# Patient Record
Sex: Female | Born: 1965 | Race: White | Hispanic: No | Marital: Married | Smoking: Former smoker
Health system: Southern US, Community
[De-identification: ages and names within clinical notes are randomized; demographics above are authoritative.]

## PROBLEM LIST (undated history)

## (undated) DIAGNOSIS — F329 Major depressive disorder, single episode, unspecified: Secondary | ICD-10-CM

## (undated) DIAGNOSIS — M797 Fibromyalgia: Secondary | ICD-10-CM

## (undated) DIAGNOSIS — G8929 Other chronic pain: Secondary | ICD-10-CM

## (undated) DIAGNOSIS — M545 Low back pain, unspecified: Secondary | ICD-10-CM

## (undated) DIAGNOSIS — E781 Pure hyperglyceridemia: Secondary | ICD-10-CM

## (undated) DIAGNOSIS — D369 Benign neoplasm, unspecified site: Secondary | ICD-10-CM

## (undated) DIAGNOSIS — I10 Essential (primary) hypertension: Secondary | ICD-10-CM

## (undated) DIAGNOSIS — T7840XA Allergy, unspecified, initial encounter: Secondary | ICD-10-CM

## (undated) DIAGNOSIS — R519 Headache, unspecified: Secondary | ICD-10-CM

## (undated) DIAGNOSIS — K219 Gastro-esophageal reflux disease without esophagitis: Secondary | ICD-10-CM

## (undated) DIAGNOSIS — F32A Depression, unspecified: Secondary | ICD-10-CM

## (undated) DIAGNOSIS — R51 Headache: Secondary | ICD-10-CM

## (undated) HISTORY — DX: Benign neoplasm, unspecified site: D36.9

## (undated) HISTORY — DX: Low back pain: M54.5

## (undated) HISTORY — DX: Major depressive disorder, single episode, unspecified: F32.9

## (undated) HISTORY — DX: Fibromyalgia: M79.7

## (undated) HISTORY — DX: Headache, unspecified: R51.9

## (undated) HISTORY — PX: POLYPECTOMY: SHX149

## (undated) HISTORY — PX: WISDOM TOOTH EXTRACTION: SHX21

## (undated) HISTORY — DX: Depression, unspecified: F32.A

## (undated) HISTORY — DX: Other chronic pain: G89.29

## (undated) HISTORY — DX: Low back pain, unspecified: M54.50

## (undated) HISTORY — PX: TONSILLECTOMY: SUR1361

## (undated) HISTORY — DX: Headache: R51

## (undated) HISTORY — DX: Essential (primary) hypertension: I10

## (undated) HISTORY — DX: Pure hyperglyceridemia: E78.1

## (undated) HISTORY — DX: Allergy, unspecified, initial encounter: T78.40XA

---

## 1999-05-11 ENCOUNTER — Other Ambulatory Visit: Admission: RE | Admit: 1999-05-11 | Discharge: 1999-05-11 | Payer: Self-pay | Admitting: Obstetrics and Gynecology

## 1999-06-10 ENCOUNTER — Ambulatory Visit (HOSPITAL_COMMUNITY): Admission: RE | Admit: 1999-06-10 | Discharge: 1999-06-10 | Payer: Self-pay

## 1999-08-17 ENCOUNTER — Observation Stay (HOSPITAL_COMMUNITY): Admission: RE | Admit: 1999-08-17 | Discharge: 1999-08-17 | Payer: Self-pay | Admitting: Surgery

## 2000-06-05 HISTORY — PX: CHOLECYSTECTOMY: SHX55

## 2000-07-06 ENCOUNTER — Other Ambulatory Visit: Admission: RE | Admit: 2000-07-06 | Discharge: 2000-07-06 | Payer: Self-pay | Admitting: Obstetrics and Gynecology

## 2000-09-29 ENCOUNTER — Emergency Department (HOSPITAL_COMMUNITY): Admission: EM | Admit: 2000-09-29 | Discharge: 2000-09-29 | Payer: Self-pay | Admitting: Emergency Medicine

## 2000-09-29 ENCOUNTER — Encounter: Payer: Self-pay | Admitting: Emergency Medicine

## 2001-09-03 ENCOUNTER — Other Ambulatory Visit: Admission: RE | Admit: 2001-09-03 | Discharge: 2001-09-03 | Payer: Self-pay | Admitting: Obstetrics and Gynecology

## 2006-06-07 ENCOUNTER — Encounter: Admission: RE | Admit: 2006-06-07 | Discharge: 2006-06-07 | Payer: Self-pay | Admitting: Obstetrics and Gynecology

## 2006-09-18 ENCOUNTER — Encounter: Admission: RE | Admit: 2006-09-18 | Discharge: 2006-09-18 | Payer: Self-pay | Admitting: Obstetrics and Gynecology

## 2011-06-01 ENCOUNTER — Encounter (HOSPITAL_COMMUNITY): Payer: Self-pay | Admitting: Pharmacy Technician

## 2011-06-08 ENCOUNTER — Encounter (HOSPITAL_COMMUNITY): Payer: Self-pay

## 2011-06-08 ENCOUNTER — Encounter (HOSPITAL_COMMUNITY)
Admission: RE | Admit: 2011-06-08 | Discharge: 2011-06-08 | Disposition: A | Discharge status: Home / Self Care | Payer: 59 | Source: Ambulatory Visit | Attending: Orthopedic Surgery | Admitting: Orthopedic Surgery

## 2011-06-08 HISTORY — DX: Gastro-esophageal reflux disease without esophagitis: K21.9

## 2011-06-08 LAB — URINALYSIS, ROUTINE W REFLEX MICROSCOPIC
Ketones, ur: NEGATIVE mg/dL
Leukocytes, UA: NEGATIVE
Nitrite: NEGATIVE
Specific Gravity, Urine: 1.008 (ref 1.005–1.030)
pH: 6 (ref 5.0–8.0)

## 2011-06-08 LAB — CBC
MCH: 28.2 pg (ref 26.0–34.0)
MCV: 85.9 fL (ref 78.0–100.0)
Platelets: 286 10*3/uL (ref 150–400)
RDW: 13.3 % (ref 11.5–15.5)
WBC: 8.9 10*3/uL (ref 4.0–10.5)

## 2011-06-08 LAB — COMPREHENSIVE METABOLIC PANEL
AST: 13 U/L (ref 0–37)
Albumin: 3.9 g/dL (ref 3.5–5.2)
Calcium: 9.5 mg/dL (ref 8.4–10.5)
Creatinine, Ser: 0.69 mg/dL (ref 0.50–1.10)

## 2011-06-08 LAB — HCG, SERUM, QUALITATIVE: Preg, Serum: NEGATIVE

## 2011-06-08 LAB — PROTIME-INR: INR: 0.97 (ref 0.00–1.49)

## 2011-06-08 LAB — SURGICAL PCR SCREEN
MRSA, PCR: NEGATIVE
Staphylococcus aureus: NEGATIVE

## 2011-06-08 NOTE — Patient Instructions (Addendum)
20 SHANINA KEPPLE  06/08/2011   Your procedure is scheduled on:06-16-2011  Report to [facility] at 0515 AM.  Call this number if you have problems the morning of surgery: [phone]   Remember:   Do not eat food or drink liquids:After Midnight.    take these medicines the morning of surgery with A SIP OF WATER:lyrica, celebrex   Do not wear jewelry, make-up or nail polish.  Do not wear lotions, powders, or perfumes. .  Do not bring valuables to the hospital.  Contacts, dentures or bridgework may not be worn into surgery.  Leave suitcase in the car. After surgery it may be brought to your room.  For patients admitted to the hospital, checkout time is 11:00 AM the day of discharge.   Patients discharged the day of surgery will not be allowed to drive home.  Name and phone number of your driver:  Special Instructions: CHG Shower Use Special Wash: 1/2 bottle night before surgery and 1/2 bottle morning of surgery. neck down avoid private area.do not shave for 2 days before showers. Spouse jeff driver day of surgery cell [phone]  Please read over the following fact sheets that you were given: MRSA Information Cain Sieve, rn [facility] pre op nurse phone number [phone]

## 2011-06-16 ENCOUNTER — Ambulatory Visit (HOSPITAL_COMMUNITY)
Admission: RE | Admit: 2011-06-16 | Discharge: 2011-06-16 | Disposition: A | Discharge status: Home / Self Care | Payer: 59 | Source: Ambulatory Visit | Attending: Orthopedic Surgery | Admitting: Orthopedic Surgery

## 2011-06-16 ENCOUNTER — Encounter (HOSPITAL_COMMUNITY): Payer: Self-pay | Admitting: *Deleted

## 2011-06-16 ENCOUNTER — Encounter (HOSPITAL_COMMUNITY): Payer: Self-pay | Admitting: Anesthesiology

## 2011-06-16 ENCOUNTER — Ambulatory Visit (HOSPITAL_COMMUNITY): Payer: 59 | Admitting: Anesthesiology

## 2011-06-16 ENCOUNTER — Encounter (HOSPITAL_COMMUNITY)
Admission: RE | Disposition: A | Discharge status: Home / Self Care | Payer: Self-pay | Source: Ambulatory Visit | Attending: Orthopedic Surgery

## 2011-06-16 DIAGNOSIS — Z01812 Encounter for preprocedural laboratory examination: Secondary | ICD-10-CM | POA: Insufficient documentation

## 2011-06-16 DIAGNOSIS — K219 Gastro-esophageal reflux disease without esophagitis: Secondary | ICD-10-CM | POA: Insufficient documentation

## 2011-06-16 DIAGNOSIS — M25559 Pain in unspecified hip: Secondary | ICD-10-CM | POA: Insufficient documentation

## 2011-06-16 DIAGNOSIS — R1013 Epigastric pain: Secondary | ICD-10-CM | POA: Insufficient documentation

## 2011-06-16 DIAGNOSIS — M7061 Trochanteric bursitis, right hip: Secondary | ICD-10-CM

## 2011-06-16 DIAGNOSIS — M76899 Other specified enthesopathies of unspecified lower limb, excluding foot: Secondary | ICD-10-CM | POA: Insufficient documentation

## 2011-06-16 DIAGNOSIS — Z79899 Other long term (current) drug therapy: Secondary | ICD-10-CM | POA: Insufficient documentation

## 2011-06-16 HISTORY — PX: EXCISION/RELEASE BURSA HIP: SHX5014

## 2011-06-16 SURGERY — RELEASE, BURSA, TROCHANTERIC
Anesthesia: Spinal | Site: Hip | Laterality: Right

## 2011-06-16 MED ORDER — CEFAZOLIN SODIUM-DEXTROSE 2-3 GM-% IV SOLR
INTRAVENOUS | Status: AC
  Filled 2011-06-16: qty 50

## 2011-06-16 MED ORDER — HETASTARCH-ELECTROLYTES 6 % IV SOLN
INTRAVENOUS | Status: DC | PRN
  Administered 2011-06-16: 08:00:00 via INTRAVENOUS

## 2011-06-16 MED ORDER — MEPERIDINE HCL 50 MG/ML IJ SOLN: 6.2500 mg | INTRAMUSCULAR | Status: DC | PRN

## 2011-06-16 MED ORDER — BUPIVACAINE LIPOSOME 1.3 % IJ SUSP
20.0000 mL | Freq: Once | INTRAMUSCULAR | Status: AC
  Administered 2011-06-16: 70 mL
  Filled 2011-06-16: qty 20

## 2011-06-16 MED ORDER — PROPOFOL 10 MG/ML IV EMUL
INTRAVENOUS | Status: DC | PRN
  Administered 2011-06-16: 140 ug/kg/min via INTRAVENOUS

## 2011-06-16 MED ORDER — HYDROMORPHONE HCL PF 1 MG/ML IJ SOLN
0.2500 mg | INTRAMUSCULAR | Status: DC | PRN
  Administered 2011-06-16 (×2): 0.5 mg via INTRAVENOUS

## 2011-06-16 MED ORDER — OXYCODONE-ACETAMINOPHEN 5-325 MG PO TABS: 1.0000 | ORAL_TABLET | ORAL | Status: AC | PRN

## 2011-06-16 MED ORDER — CEFAZOLIN SODIUM-DEXTROSE 2-3 GM-% IV SOLR
2.0000 g | Freq: Once | INTRAVENOUS | Status: AC
  Administered 2011-06-16: 2 g via INTRAVENOUS

## 2011-06-16 MED ORDER — METHOCARBAMOL 500 MG PO TABS: 500.0000 mg | ORAL_TABLET | Freq: Four times a day (QID) | ORAL | Status: AC

## 2011-06-16 MED ORDER — ACETAMINOPHEN 10 MG/ML IV SOLN
INTRAVENOUS | Status: DC | PRN
  Administered 2011-06-16: 1000 mg via INTRAVENOUS

## 2011-06-16 MED ORDER — HYDROMORPHONE HCL PF 1 MG/ML IJ SOLN
INTRAMUSCULAR | Status: AC
  Filled 2011-06-16: qty 1

## 2011-06-16 MED ORDER — OXYCODONE-ACETAMINOPHEN 5-325 MG/5ML PO SOLN
ORAL | Status: AC
  Administered 2011-06-16: 5 mL
  Filled 2011-06-16: qty 5

## 2011-06-16 MED ORDER — LIDOCAINE HCL (CARDIAC) 20 MG/ML IV SOLN
INTRAVENOUS | Status: DC | PRN
  Administered 2011-06-16: 40 mg via INTRAVENOUS

## 2011-06-16 MED ORDER — EPHEDRINE SULFATE 50 MG/ML IJ SOLN
INTRAMUSCULAR | Status: DC | PRN
  Administered 2011-06-16 (×2): 5 mg via INTRAVENOUS

## 2011-06-16 MED ORDER — PROMETHAZINE HCL 25 MG/ML IJ SOLN: 6.2500 mg | INTRAMUSCULAR | Status: DC | PRN

## 2011-06-16 MED ORDER — ACETAMINOPHEN 10 MG/ML IV SOLN
INTRAVENOUS | Status: AC
  Filled 2011-06-16: qty 100

## 2011-06-16 MED ORDER — ONDANSETRON HCL 4 MG/2ML IJ SOLN
INTRAMUSCULAR | Status: DC | PRN
  Administered 2011-06-16: 4 mg via INTRAVENOUS

## 2011-06-16 MED ORDER — FENTANYL CITRATE 0.05 MG/ML IJ SOLN
INTRAMUSCULAR | Status: DC | PRN
  Administered 2011-06-16: 50 ug via INTRAVENOUS
  Administered 2011-06-16 (×2): 25 ug via INTRAVENOUS
  Administered 2011-06-16: 50 ug via INTRAVENOUS
  Administered 2011-06-16: 25 ug via INTRAVENOUS
  Administered 2011-06-16: 50 ug via INTRAVENOUS
  Administered 2011-06-16: 25 ug via INTRAVENOUS

## 2011-06-16 MED ORDER — MIDAZOLAM HCL 5 MG/5ML IJ SOLN
INTRAMUSCULAR | Status: DC | PRN
  Administered 2011-06-16 (×2): 1 mg via INTRAVENOUS

## 2011-06-16 MED ORDER — LACTATED RINGERS IV SOLN: INTRAVENOUS | Status: DC

## 2011-06-16 MED ORDER — LACTATED RINGERS IV SOLN
INTRAVENOUS | Status: DC | PRN
  Administered 2011-06-16 (×2): via INTRAVENOUS

## 2011-06-16 SURGICAL SUPPLY — 37 items
ANCH SUT 2 CP-2 EBND QANCHR+ (Anchor) ×1 IMPLANT
ANCHOR SUPER QUICK (Anchor) ×2 IMPLANT
BAG SPEC THK2 15X12 ZIP CLS (MISCELLANEOUS) ×1
BAG ZIPLOCK 12X15 (MISCELLANEOUS) ×2 IMPLANT
BLADE EXTENDED COATED 6.5IN (ELECTRODE) IMPLANT
CLOTH BEACON ORANGE TIMEOUT ST (SAFETY) ×2 IMPLANT
DRAPE INCISE IOBAN 66X45 STRL (DRAPES) ×2 IMPLANT
DRAPE ORTHO SPLIT 77X108 STRL (DRAPES) ×4
DRAPE POUCH INSTRU U-SHP 10X18 (DRAPES) ×2 IMPLANT
DRAPE SURG ORHT 6 SPLT 77X108 (DRAPES) ×2 IMPLANT
DRAPE U-SHAPE 47X51 STRL (DRAPES) ×2 IMPLANT
DRSG ADAPTIC 3X8 NADH LF (GAUZE/BANDAGES/DRESSINGS) ×2 IMPLANT
DRSG EMULSION OIL 3X16 NADH (GAUZE/BANDAGES/DRESSINGS) ×1 IMPLANT
DRSG MEPILEX BORDER 4X4 (GAUZE/BANDAGES/DRESSINGS) IMPLANT
DRSG MEPILEX BORDER 4X8 (GAUZE/BANDAGES/DRESSINGS) ×1 IMPLANT
ELECT REM PT RETURN 9FT ADLT (ELECTROSURGICAL) ×2
ELECTRODE REM PT RTRN 9FT ADLT (ELECTROSURGICAL) ×1 IMPLANT
GLOVE BIO SURGEON STRL SZ7.5 (GLOVE) ×2 IMPLANT
GLOVE BIO SURGEON STRL SZ8 (GLOVE) ×4 IMPLANT
GOWN STRL NON-REIN LRG LVL3 (GOWN DISPOSABLE) ×2 IMPLANT
GOWN STRL REIN XL XLG (GOWN DISPOSABLE) ×2 IMPLANT
IV SET HUBERPLUS 22X1 SAFETY (NEEDLE) ×2 IMPLANT
MANIFOLD NEPTUNE II (INSTRUMENTS) ×2 IMPLANT
NS IRRIG 1000ML POUR BTL (IV SOLUTION) ×2 IMPLANT
PACK TOTAL JOINT (CUSTOM PROCEDURE TRAY) ×2 IMPLANT
POSITIONER SURGICAL ARM (MISCELLANEOUS) ×2 IMPLANT
SPONGE GAUZE 4X4 12PLY (GAUZE/BANDAGES/DRESSINGS) ×2 IMPLANT
STAPLER VISISTAT 35W (STAPLE) ×2 IMPLANT
STRIP CLOSURE SKIN 1/2X4 (GAUZE/BANDAGES/DRESSINGS) ×2 IMPLANT
SUT MNCRL AB 4-0 PS2 18 (SUTURE) ×2 IMPLANT
SUT VIC AB 1 CT1 27 (SUTURE) ×6
SUT VIC AB 1 CT1 27XBRD ANTBC (SUTURE) ×3 IMPLANT
SUT VIC AB 2-0 CT1 27 (SUTURE) ×6
SUT VIC AB 2-0 CT1 TAPERPNT 27 (SUTURE) ×3 IMPLANT
SYR 30ML LL (SYRINGE) ×2 IMPLANT
TOWEL OR 17X26 10 PK STRL BLUE (TOWEL DISPOSABLE) ×4 IMPLANT
WATER STERILE IRR 1500ML POUR (IV SOLUTION) ×2 IMPLANT

## 2011-06-16 NOTE — Progress Notes (Signed)
Patient states epigastric discomfort better.

## 2011-06-16 NOTE — Anesthesia Preprocedure Evaluation (Addendum)
Anesthesia Evaluation  Patient identified by MRN, date of birth, ID band Patient awake    Reviewed: Allergy & Precautions, H&P , NPO status , Patient's Chart, lab work & pertinent test results  Airway Mallampati: II TM Distance: >3 FB Neck ROM: Full    Dental No notable dental hx.    Pulmonary neg pulmonary ROS,  clear to auscultation  Pulmonary exam normal       Cardiovascular neg cardio ROS Regular Normal    Neuro/Psych Negative Neurological ROS  Negative Psych ROS   GI/Hepatic negative GI ROS, Neg liver ROS,   Endo/Other  Negative Endocrine ROS  Renal/GU negative Renal ROS  Genitourinary negative   Musculoskeletal negative musculoskeletal ROS (+)   Abdominal   Peds negative pediatric ROS (+)  Hematology negative hematology ROS (+)   Anesthesia Other Findings   Reproductive/Obstetrics negative OB ROS                          Anesthesia Physical Anesthesia Plan  ASA: II  Anesthesia Plan: Spinal   Post-op Pain Management:    Induction:   Airway Management Planned:   Additional Equipment:   Intra-op Plan:   Post-operative Plan:   Informed Consent: I have reviewed the patients History and Physical, chart, labs and discussed the procedure including the risks, benefits and alternatives for the proposed anesthesia with the patient or authorized representative who has indicated his/her understanding and acceptance.   Dental advisory given  Plan Discussed with:   Anesthesia Plan Comments:         Anesthesia Quick Evaluation

## 2011-06-16 NOTE — Preoperative (Signed)
Beta Blockers   Reason not to administer Beta Blockers:Not Applicable,  Not on home BB 

## 2011-06-16 NOTE — Anesthesia Postprocedure Evaluation (Signed)
  Anesthesia Post-op Note  Patient: Tonya Elliott  Procedure(s) Performed:  EXCISION/RELEASE BURSA HIP - Right Hip Bursectomy  Patient Location: PACU  Anesthesia Type: Spinal  Level of Consciousness: awake and alert   Airway and Oxygen Therapy: Patient Spontanous Breathing  Post-op Pain: mild  Post-op Assessment: Post-op Vital signs reviewed, Patient's Cardiovascular Status Stable, Respiratory Function Stable, Patent Airway and No signs of Nausea or vomiting  Post-op Vital Signs: stable  Complications: No apparent anesthesia complications. Patient c/o mild epigastric pain. She has had this before. ?GERD. Antacid offered and refused. No cardiac suspicion.

## 2011-06-16 NOTE — H&P (Signed)
  CC- Tonya Elliott is a 46 y.o. female who presents with right hip pain  Hip Pain: Patient complains of right hip pain. Onset of the symptoms was several months ago. Inciting event: none. Current symptoms include lateral pain. Associated symptoms: none, pain lying on right side. Aggravating symptoms: going up and down stairs. Patient's course of pain: gradually worsening. Patient has had no prior hip problems. Previous visits for this problem: yes-PT and injections. Evaluation to date: plain films, which were normal. MRI with gluteal tendon tear..  Treatment to date: physical therapy, which has been somewhat effective.  Past Medical History  Diagnosis Date  . GERD (gastroesophageal reflux disease)     diet controlled    Past Surgical History  Procedure Date  . Cholecystectomy 2002  . Tonsillectomy age 9    Prior to Admission medications   Medication Sig Start Date End Date Taking? Authorizing Provider  carisoprodol (SOMA) 350 MG tablet Take 350 mg by mouth every evening.    Yes Historical Provider, MD  celecoxib (CELEBREX) 200 MG capsule Take 200 mg by mouth daily.    Yes Historical Provider, MD  Multiple Vitamin (MULITIVITAMIN WITH MINERALS) TABS Take 1 tablet by mouth daily.     Yes Historical Provider, MD  pregabalin (LYRICA) 150 MG capsule Take 150 mg by mouth 2 (two) times daily.    Yes Historical Provider, MD  etonogestrel-ethinyl estradiol (NUVARING) 0.12-0.015 MG/24HR vaginal ring Place 1 each vaginally every 28 (twenty-eight) days. Insert vaginally and leave in place for 3 consecutive weeks, then remove for 1 week.     Historical Provider, MD    Physical Examination: General appearance - alert, well appearing, and in no distress Mental status - alert, oriented to person, place, and time Chest - clear to auscultation, no wheezes, rales or rhonchi, symmetric air entry Heart - normal rate, regular rhythm, normal S1, S2, no murmurs, rubs, clicks or gallops Abdomen - soft,  nontender, nondistended, no masses or organomegaly Neurological - alert, oriented, normal speech, no focal findings or movement disorder noted  A right hip exam was performed. TENDERNESS: lateral ROM: full STRENGTH: normal GAIT: normal  ASSESSMENT:Right hip intractible bursitis with gluteal tendon tear- Plan bursectomy and tendon repair. Discussed in detail with patient who elects to proceed.   Plan  Gus Rankin. Nuchem Grattan, MD    06/16/2011, 7:13 AM

## 2011-06-16 NOTE — Interval H&P Note (Signed)
History and Physical Interval Note:  06/16/2011 7:17 AM  Tonya Elliott  has presented today for surgery, with the diagnosis of right hip glutean tear   The various methods of treatment have been discussed with the patient and family. After consideration of risks, benefits and other options for treatment, the patient has consented to  Procedure(s): EXCISION/RELEASE BURSA HIP as a surgical intervention .  The patients' history has been reviewed, patient examined, no change in status, stable for surgery.  I have reviewed the patients' chart and labs.  Questions were answered to the patient's satisfaction.     Loanne Drilling

## 2011-06-16 NOTE — Brief Op Note (Signed)
06/16/2011  8:08 AM  PATIENT:  Tonya Elliott  46 y.o. female  PRE-OPERATIVE DIAGNOSIS:  right hip intractable bursitis with possible gluteal tendon tear  POST-OPERATIVE DIAGNOSIS:  right hip intractable bursitis  PROCEDURE:  Procedure(s): EXCISION/RELEASE BURSA HIP  SURGEON:  Surgeon(s): Gus Rankin Sterling Mondo  PHYSICIAN ASSISTANT:   ASSISTANTS: Avel Peace, PA-C   ANESTHESIA:   spinal  EBL:  Total I/O In: 1000 [I.V.:1000] Out: -   LOCAL MEDICATIONS USED:  OTHER Exparel 20ml  COUNTS:  YES  TOURNIQUET:  * No tourniquets in log *  DICTATION: .Other Dictation: Dictation Number [id number]  PLAN OF CARE: Discharge to home after PACU  PATIENT DISPOSITION:  PACU - hemodynamically stable. Gus Rankin Khalil Belote, MD    06/16/2011, 8:13 AM    Delay start of Pharmacological VTE agent (>24hrs) due to surgical blood loss or risk of bleeding:  {YES/NO/NOT APPLICABLE:20182

## 2011-06-16 NOTE — Op Note (Signed)
Tonya Elliott, MARINOS NO.:  [id number]  MEDICAL RECORD NO.:  [id number]  LOCATION:  [facility]                         FACILITY:  [facility]  PHYSICIAN:  Ollen Gross, M.D.    DATE OF BIRTH:  09-23-1965  DATE OF PROCEDURE:  06/16/2011 DATE OF DISCHARGE:                              OPERATIVE REPORT   PREOPERATIVE DIAGNOSIS:  Right hip intractable bursitis with questionable gluteal tendon tear.  POSTOPERATIVE DIAGNOSIS:  Right hip intractable bursitis.  PROCEDURE:  Right hip bursectomy.  SURGEON:  Ollen Gross, M.D.  ASSISTANT:  Alexzandrew L. Perkins, P.A.C.  ANESTHESIA:  Spinal.  ESTIMATED BLOOD LOSS:  Minimal.  DRAINS:  None.  COMPLICATIONS:  None.  CONDITION:  Stable to Recovery.  BRIEF CLINICAL NOTE:  This is a 46 year old female who has had chronic intractable bilateral hip pain, right worse than left.  She has had injections in the past with partial temporary relief and then physical therapy without much benefit.  MRI showed bursitis with questionable gluteal tendon tear.  Given her intractable pain and failure to respond to nonoperative management, she presents now for bursectomy with possible tendon repair if there is a tear.  PROCEDURE IN DETAIL:  After successful administration of spinal anesthetic, patient was placed in left lateral decubitus position with the right side up and held with the hip positioner.  Right lower extremity was isolated from her perineum with plastic drapes and prepped and draped in usual sterile fashion.  Short lateral incision was made with a 10 blade through subcutaneous tissue to the fascia lata, which was incised in line with the skin incision.  A significant thickened bursal sac.  It was not fluid-filled.  I removed the bursa.  We internally and externally rotated the leg in order to get the full extent of the bursa.  This led to excellent visualization of the gluteus medius tendon.  I did not see any tear of the  tendon off of the greater trochanter.  I palpated the entire tendon; it was intact.  I was able to palpate under the gluteus medius to the gluteus minimus and did not palpate any tears there.  The wound was then copiously irrigated with saline solution.  Fascia lata was closed with interrupted #1 Vicryl leaving open a small triangular area over the greater trochanter, so would not rub and cause any more friction and bursal formation.  I then used a mixture of  20 mL of Exparel mixed with 50 mL of saline to inject the gluteal musculature, the IT band, and the subcu tissues.  Subcu was then closed with interrupted #1 and interrupted 2-0 Vicryl and subcuticular running 4-0 Monocryl.  Incisions cleaned and dried and Steri-Strips and bulky sterile dressing applied. She was then awakened and transported to Recovery in stable condition.     Ollen Gross, M.D.     FA/MEDQ  D:  06/16/2011  T:  06/16/2011  Job:  [id number]

## 2011-06-16 NOTE — Transfer of Care (Signed)
Immediate Anesthesia Transfer of Care Note  Patient: Tonya Elliott  Procedure(s) Performed:  EXCISION/RELEASE BURSA HIP - Right Hip Bursectomy  Patient Location: PACU  Anesthesia Type: MAC and Spinal  Level of Consciousness: awake, alert , oriented and patient cooperative  Airway & Oxygen Therapy: Patient Spontanous Breathing and Patient connected to face mask  Post-op Assessment: Report given to PACU RN and Post -op Vital signs reviewed and stable  Post vital signs: Reviewed and stable Filed Vitals:   06/16/11 0837  BP: 119/85  Pulse: 84  Temp: 36.4 C  Resp: 12    Complications: No apparent anesthesia complications

## 2011-06-16 NOTE — Progress Notes (Signed)
Patient complains of epigastric discomfort- possible indigestion- according to patient. Dr. Acey Lav in and talked with patient. Patient does not want an antacid at this time.

## 2011-06-21 ENCOUNTER — Encounter (HOSPITAL_COMMUNITY): Payer: Self-pay | Admitting: Orthopedic Surgery

## 2011-10-24 ENCOUNTER — Ambulatory Visit (INDEPENDENT_AMBULATORY_CARE_PROVIDER_SITE_OTHER): Payer: 59 | Admitting: Family Medicine

## 2011-10-24 VITALS — BP 135/86 | HR 69 | Temp 98.3°F | Resp 16 | Ht 67.0 in | Wt 188.0 lb

## 2011-10-24 DIAGNOSIS — H6690 Otitis media, unspecified, unspecified ear: Secondary | ICD-10-CM

## 2011-10-24 DIAGNOSIS — H6980 Other specified disorders of Eustachian tube, unspecified ear: Secondary | ICD-10-CM

## 2011-10-24 MED ORDER — FLUCONAZOLE 150 MG PO TABS: ORAL_TABLET | ORAL | Status: DC

## 2011-10-24 MED ORDER — FLUTICASONE PROPIONATE 50 MCG/ACT NA SUSP: 2.0000 | Freq: Every day | NASAL | Status: DC

## 2011-10-24 MED ORDER — PREDNISONE 20 MG PO TABS: 20.0000 mg | ORAL_TABLET | Freq: Every day | ORAL | Status: AC

## 2011-10-24 MED ORDER — AMOXICILLIN 875 MG PO TABS: 875.0000 mg | ORAL_TABLET | Freq: Two times a day (BID) | ORAL | Status: AC

## 2011-10-24 NOTE — Progress Notes (Signed)
  Subjective:    Patient ID: Tonya Elliott, female    DOB: April 15, 1966, 46 y.o.   MRN: [id number]  HPI  Patient presents complaining of severe otalgia since that started yesterday as patient was  traveling back from [place], [place]. Denies otic discharge Reduced hearing Denies vertiginous symptoms  URI symptoms that started last Wednesday  Review of Systems     Objective:   Physical Exam  Constitutional: She appears well-developed.  HENT:  Right Ear: Tympanic membrane is erythematous and retracted.  Left Ear: Tympanic membrane is erythematous and retracted.  Nose: Rhinorrhea present.  Neck: Neck supple.  Cardiovascular: Normal rate, regular rhythm and normal heart sounds.   Pulmonary/Chest: Effort normal and breath sounds normal.  Neurological: She is alert.  Skin: Skin is warm.          Assessment & Plan:  (B) OM ETD Recent (R) trochanteric bursectomy(Dr. Alusio)   See medications on AVS Anticipatory guidance

## 2011-11-09 ENCOUNTER — Ambulatory Visit (INDEPENDENT_AMBULATORY_CARE_PROVIDER_SITE_OTHER): Payer: 59 | Admitting: Emergency Medicine

## 2011-11-09 VITALS — BP 130/81 | HR 76 | Temp 98.1°F | Resp 16 | Ht 68.0 in | Wt 189.0 lb

## 2011-11-09 DIAGNOSIS — H6690 Otitis media, unspecified, unspecified ear: Secondary | ICD-10-CM

## 2011-11-09 DIAGNOSIS — H66009 Acute suppurative otitis media without spontaneous rupture of ear drum, unspecified ear: Secondary | ICD-10-CM

## 2011-11-09 MED ORDER — AMOXICILLIN-POT CLAVULANATE 875-125 MG PO TABS: 1.0000 | ORAL_TABLET | Freq: Two times a day (BID) | ORAL | Status: AC

## 2011-11-09 MED ORDER — DM-GUAIFENESIN ER 60-1200 MG PO TB12: 1.0000 | ORAL_TABLET | Freq: Two times a day (BID) | ORAL | Status: AC

## 2011-11-09 NOTE — Progress Notes (Signed)
  Subjective:    Patient ID: Tonya Elliott, female    DOB: November 27, 1965, 46 y.o.   MRN: [id number]  Otalgia  There is pain in both ears. This is a recurrent problem. The current episode started in the past 7 days. The problem occurs constantly. The problem has been unchanged. There has been no fever. The pain is at a severity of 3/10. The pain is mild. Associated symptoms include rhinorrhea. Pertinent negatives include no abdominal pain, coughing, diarrhea, drainage, ear discharge, headaches, hearing loss, neck pain, rash, sore throat or vomiting. She has tried acetaminophen for the symptoms. The treatment provided no relief.      Review of Systems  Constitutional: Negative.   HENT: Positive for ear pain and rhinorrhea. Negative for hearing loss, sore throat, neck pain and ear discharge.   Eyes: Negative.   Respiratory: Negative.  Negative for cough.   Cardiovascular: Negative.   Gastrointestinal: Negative.  Negative for vomiting, abdominal pain and diarrhea.  Genitourinary: Negative.   Musculoskeletal: Negative.   Skin: Negative for rash.  Neurological: Negative for headaches.       Objective:   Physical Exam  Constitutional: She appears well-developed and well-nourished.  HENT:  Head: Normocephalic and atraumatic.  Right Ear: Ear canal normal. Tympanic membrane is bulging. A middle ear effusion is present.  Left Ear: Ear canal normal. Tympanic membrane is bulging. A middle ear effusion is present.  Eyes: Conjunctivae and EOM are normal. Pupils are equal, round, and reactive to light.  Neck: Normal range of motion. Neck supple.  Cardiovascular: Normal rate, regular rhythm and normal heart sounds.   Pulmonary/Chest: Effort normal and breath sounds normal.  Abdominal: Soft.  Musculoskeletal: Normal range of motion.  Neurological: She is alert.  Skin: Skin is dry.          Assessment & Plan:  Patient flew to [place] with a nasal drip and congestion.  Developed barotrauma on  flight.  Instructed to resume use of flonase

## 2011-11-17 ENCOUNTER — Ambulatory Visit (INDEPENDENT_AMBULATORY_CARE_PROVIDER_SITE_OTHER): Payer: 59 | Admitting: Emergency Medicine

## 2011-11-17 VITALS — BP 103/68 | HR 67 | Temp 97.7°F | Resp 16 | Ht 69.0 in | Wt 187.0 lb

## 2011-11-17 DIAGNOSIS — H6690 Otitis media, unspecified, unspecified ear: Secondary | ICD-10-CM

## 2011-11-17 DIAGNOSIS — H6980 Other specified disorders of Eustachian tube, unspecified ear: Secondary | ICD-10-CM

## 2011-11-17 NOTE — Progress Notes (Signed)
Subjective:    Patient ID: Tonya Elliott, female    DOB: 1965/09/10, 46 y.o.   MRN: [id number]  HPI    Review of Systems     Objective:   Physical Exam        Assessment & Plan:      [facility] [address] [place] [address] Phone: [phone] Fax: [phone]   Patient Name: Tonya Elliott Date of Birth: 19-Oct-1965 Medical Record Number: [id number] Gender: female Date of Encounter: 11/17/2011  History of Present Illness:  Tonya Elliott is a 46 y.o. very pleasant female patient who presents with the following:  Continued symptoms of right eustachian tube dysfunction.  Continuing treatment and has improved but not resolved.  Patient Active Problem List  Diagnosis  (none) - all problems resolved or deleted   Past Medical History  Diagnosis Date  . GERD (gastroesophageal reflux disease)     diet controlled   Past Surgical History  Procedure Date  . Cholecystectomy 2002  . Tonsillectomy age 28  . Excision/release bursa hip 06/16/2011    Procedure: EXCISION/RELEASE BURSA HIP;  Surgeon: Loanne Drilling;  Location: [facility];  Service: Orthopedics;  Laterality: Right;  Right Hip Bursectomy   History  Substance Use Topics  . Smoking status: Former Smoker -- 1.0 packs/day for 10 years    Quit date: 06/05/2000  . Smokeless tobacco: Never Used  . Alcohol Use: Yes     social   No family history on file. Allergies  Allergen Reactions  . Lemon Juice Hives    Medication list has been reviewed and updated.  Prior to Admission medications   Medication Sig Start Date End Date Taking? Authorizing Provider  amoxicillin-clavulanate (AUGMENTIN) 875-125 MG per tablet Take 1 tablet by mouth 2 (two) times daily. 11/09/11 11/19/11 Yes Phillips Odor, MD  carisoprodol (SOMA) 350 MG tablet Take 350 mg by mouth every evening.    Yes Historical Provider, MD  Dextromethorphan-Guaifenesin (MUCINEX DM MAXIMUM STRENGTH) 60-1200 MG per 12 hr tablet Take  1 tablet by mouth every 12 (twelve) hours. 11/09/11 11/19/11 Yes Phillips Odor, MD  etonogestrel-ethinyl estradiol (NUVARING) 0.12-0.015 MG/24HR vaginal ring Place 1 each vaginally every 28 (twenty-eight) days. Insert vaginally and leave in place for 3 consecutive weeks, then remove for 1 week.    Yes Historical Provider, MD  fluconazole (DIFLUCAN) 150 MG tablet 1 po today. Repeat in 5 days. 10/24/11  Yes Dois Davenport, MD  Multiple Vitamin (MULITIVITAMIN WITH MINERALS) TABS Take 1 tablet by mouth daily.     Yes Historical Provider, MD  pregabalin (LYRICA) 150 MG capsule Take 150 mg by mouth 2 (two) times daily.    Yes Historical Provider, MD  celecoxib (CELEBREX) 200 MG capsule Take 200 mg by mouth daily.     Historical Provider, MD  fluticasone (FLONASE) 50 MCG/ACT nasal spray Place 2 sprays into the nose daily. 10/24/11 10/23/12  Dois Davenport, MD    Review of Systems:  All systems reviewed normal except HEENT significant for right ear pressure and discomfort.  No pain or drainage.  No fever or chills.    Physical Examination: Filed Vitals:   11/17/11 0923  BP: 103/68  Pulse: 67  Temp: 97.7 F (36.5 C)  Resp: 16   Filed Vitals:   11/17/11 0923  Height: 5\' 9"  (1.753 m)  Weight: 187 lb (84.823 kg)   Body mass index is 27.62 kg/(m^2). Ideal Body Weight: Weight in (lb) to have  BMI = 25: 168.9   WDWN, NAD HEENT fails valsalva on right.  PRRERLA EOMI TM negative, oropharynx neg Neck supple Chest CTA Heart RRR no murmur  Abd benign  Assessment and Plan: Eustachian tube dysfunction.    Plan continue current meds.   If no resolution in one week will refer to ENT.  Patient aggreable.  Carmelina Dane, MD

## 2015-04-26 ENCOUNTER — Encounter: Payer: Self-pay | Admitting: Internal Medicine

## 2015-05-03 ENCOUNTER — Encounter: Payer: Self-pay | Admitting: Internal Medicine

## 2016-03-03 ENCOUNTER — Encounter: Payer: Self-pay | Admitting: Internal Medicine

## 2016-03-24 ENCOUNTER — Ambulatory Visit (AMBULATORY_SURGERY_CENTER): Payer: Self-pay

## 2016-03-24 VITALS — Ht 68.0 in | Wt 202.8 lb

## 2016-03-24 DIAGNOSIS — Z800 Family history of malignant neoplasm of digestive organs: Secondary | ICD-10-CM

## 2016-03-24 MED ORDER — SUPREP BOWEL PREP KIT 17.5-3.13-1.6 GM/177ML PO SOLN: 1.0000 | Freq: Once | ORAL | 0 refills | Status: AC

## 2016-03-24 MED FILL — SUPREP BOWEL PREP KIT: 17.5-3.13-1 | 1 days supply | Qty: 354 | Fill #0

## 2016-03-24 NOTE — Progress Notes (Signed)
No allergies to eggs or soy No diet meds No home oxygen No past problems with anesthesia  Declined emmi 

## 2016-04-07 ENCOUNTER — Ambulatory Visit (AMBULATORY_SURGERY_CENTER): Payer: Commercial Managed Care - HMO | Admitting: Internal Medicine

## 2016-04-07 ENCOUNTER — Encounter: Payer: Self-pay | Admitting: Internal Medicine

## 2016-04-07 VITALS — BP 148/78 | HR 74 | Temp 98.6°F | Resp 17 | Ht 68.0 in | Wt 202.0 lb

## 2016-04-07 DIAGNOSIS — D123 Benign neoplasm of transverse colon: Secondary | ICD-10-CM

## 2016-04-07 DIAGNOSIS — K635 Polyp of colon: Secondary | ICD-10-CM

## 2016-04-07 DIAGNOSIS — Z1211 Encounter for screening for malignant neoplasm of colon: Secondary | ICD-10-CM

## 2016-04-07 DIAGNOSIS — D129 Benign neoplasm of anus and anal canal: Secondary | ICD-10-CM

## 2016-04-07 DIAGNOSIS — Z800 Family history of malignant neoplasm of digestive organs: Secondary | ICD-10-CM

## 2016-04-07 DIAGNOSIS — D120 Benign neoplasm of cecum: Secondary | ICD-10-CM

## 2016-04-07 DIAGNOSIS — K621 Rectal polyp: Secondary | ICD-10-CM | POA: Diagnosis not present

## 2016-04-07 DIAGNOSIS — D128 Benign neoplasm of rectum: Secondary | ICD-10-CM

## 2016-04-07 HISTORY — PX: COLONOSCOPY: SHX174

## 2016-04-07 MED ORDER — SODIUM CHLORIDE 0.9 % IV SOLN: 500.0000 mL | INTRAVENOUS | Status: DC

## 2016-04-07 NOTE — Op Note (Signed)
Tonya Elliott Patient Name: Tonya Elliott Procedure Date: 04/07/2016 2:58 PM MRN: [id number] Endoscopist: Docia Chuck. Tonya Elliott , MD Age: 50 Referring MD:  Date of Birth: [birth year] Gender: Female Account #: [id number] Procedure:                Colonoscopy, with snare polypectomy x 3. Submucosal                            injection Indications:              Screening for colorectal malignant neoplasm Medicines:                Monitored Anesthesia Care Procedure:                Pre-Anesthesia Assessment:                           - Prior to the procedure, a History and Physical                            was performed, and patient medications and                            allergies were reviewed. The patient's tolerance of                            previous anesthesia was also reviewed. The risks                            and benefits of the procedure and the sedation                            options and risks were discussed with the patient.                            All questions were answered, and informed consent                            was obtained. Prior Anticoagulants: The patient has                            taken no previous anticoagulant or antiplatelet                            agents. ASA Grade Assessment: II - A patient with                            mild systemic disease. After reviewing the risks                            and benefits, the patient was deemed in                            satisfactory condition to undergo the procedure.  After obtaining informed consent, the colonoscope                            was passed under direct vision. Throughout the                            procedure, the patient's blood pressure, pulse, and                            oxygen saturations were monitored continuously. The                            Model CF-HQ190L [id number]) scope was introduced                            through the anus  and advanced to the the cecum,                            identified by appendiceal orifice and ileocecal                            valve. The ileocecal valve, appendiceal orifice,                            and rectum were photographed. The quality of the                            bowel preparation was excellent. The colonoscopy                            was performed without difficulty. The patient                            tolerated the procedure well. The bowel preparation                            used was SUPREP. Scope In: 3:11:02 PM Scope Out: 3:49:59 PM Scope Withdrawal Time: 0 hours 35 minutes 41 seconds  Total Procedure Duration: 0 hours 38 minutes 57 seconds  Findings:                 A 20 mm polyp was found in the distal transverse                            colon. The polyp was sessile. The polyp was removed                            with a saline injection-lift technique using a cold                            snare. Resection was piecemeal and retrieval were                            complete using a suction (via  the working channel).                            Area was tattooed with an injection of 3 mL of Spot                            (carbon black) just distal to the lesion. Please                            see photograph..                           Two polyps were found in the rectum and cecum. The                            polyps were 5 mm in size. These polyps were removed                            with a cold snare. Resection and retrieval were                            complete.                           The exam was otherwise without abnormality on                            direct and retroflexion views. Complications:            No immediate complications. Estimated blood loss:                            None. Estimated Blood Loss:     Estimated blood loss: none. Impression:               - One 20 mm polyp in the distal transverse colon,                             removed piecemeal using submucosal saline                            injection-lift and a cold snare. Resected and                            retrieved. Tattooed.                           - Two 5 mm polyps in the rectum and in the cecum,                            removed with a cold snare. Resected and retrieved.                           - The examination was otherwise normal on direct  and retroflexion views. Recommendation:           - Repeat colonoscopy in 6 months for surveillance.                           - Patient has a contact number available for                            emergencies. The signs and symptoms of potential                            delayed complications were discussed with the                            patient. Return to normal activities tomorrow.                            Written discharge instructions were provided to the                            patient.                           - Resume previous diet.                           - Continue present medications.                           - Await pathology results. Docia Chuck. Tonya Pastor, MD 04/07/2016 4:06:49 PM This report has been signed electronically.

## 2016-04-07 NOTE — Progress Notes (Signed)
Called to room to assist during endoscopic procedure.  Patient ID and intended procedure confirmed with present staff. Received instructions for my participation in the procedure from the performing physician.  

## 2016-04-07 NOTE — Progress Notes (Signed)
To recovery, report to Sechler, RN, VSS 

## 2016-04-07 NOTE — Progress Notes (Signed)
Discussed with patient and carepartner that she may have a bruise on buttocks from holding "cheeks" during procedure to maintain air insufflation. Patient and carepartner verbalized understanding.

## 2016-04-07 NOTE — Patient Instructions (Signed)
Impression/Recommendations:  Polyp handout given to patient . Repeat colonoscopy in 6 months for surveillance.  YOU HAD AN ENDOSCOPIC PROCEDURE TODAY AT [facility]:   Refer to the procedure report that was given to you for any specific questions about what was found during the examination.  If the procedure report does not answer your questions, please call your gastroenterologist to clarify.  If you requested that your care partner not be given the details of your procedure findings, then the procedure report has been included in a sealed envelope for you to review at your convenience later.  YOU SHOULD EXPECT: Some feelings of bloating in the abdomen. Passage of more gas than usual.  Walking can help get rid of the air that was put into your GI tract during the procedure and reduce the bloating. If you had a lower endoscopy (such as a colonoscopy or flexible sigmoidoscopy) you may notice spotting of blood in your stool or on the toilet paper. If you underwent a bowel prep for your procedure, you may not have a normal bowel movement for a few days.  Please Note:  You might notice some irritation and congestion in your nose or some drainage.  This is from the oxygen used during your procedure.  There is no need for concern and it should clear up in a day or so.  SYMPTOMS TO REPORT IMMEDIATELY:   Following lower endoscopy (colonoscopy or flexible sigmoidoscopy):  Excessive amounts of blood in the stool  Significant tenderness or worsening of abdominal pains  Swelling of the abdomen that is new, acute  Fever of 100F or higher For urgent or emergent issues, a gastroenterologist can be reached at any hour by calling [phone].   DIET:  We do recommend a small meal at first, but then you may proceed to your regular diet.  Drink plenty of fluids but you should avoid alcoholic beverages for 24 hours.  ACTIVITY:  You should plan to take it easy for the rest of today and you  should NOT DRIVE or use heavy machinery until tomorrow (because of the sedation medicines used during the test).    FOLLOW UP: Our staff will call the number listed on your records the next business day following your procedure to check on you and address any questions or concerns that you may have regarding the information given to you following your procedure. If we do not reach you, we will leave a message.  However, if you are feeling well and you are not experiencing any problems, there is no need to return our call.  We will assume that you have returned to your regular daily activities without incident.  If any biopsies were taken you will be contacted by phone or by letter within the next 1-3 weeks.  Please call us at [phone] if you have not heard about the biopsies in 3 weeks.    SIGNATURES/CONFIDENTIALITY: You and/or your care partner have signed paperwork which will be entered into your electronic medical record.  These signatures attest to the fact that that the information above on your After Visit Summary has been reviewed and is understood.  Full responsibility of the confidentiality of this discharge information lies with you and/or your care-partner.

## 2016-04-10 ENCOUNTER — Telehealth: Payer: Self-pay

## 2016-04-10 NOTE — Telephone Encounter (Signed)
  Follow up Call-  Call back number 04/07/2016  Post procedure Call Back phone  # [phone]  Permission to leave phone message Yes  Some recent data might be hidden     Patient questions:  Do you have a fever, pain , or abdominal swelling? No. Pain Score  0 *  Have you tolerated food without any problems? Yes.    Have you been able to return to your normal activities? Yes.    Do you have any questions about your discharge instructions: Diet   No. Medications  No. Follow up visit  No.  Do you have questions or concerns about your Care? No.  Actions: * If pain score is 4 or above: No action needed, pain <4.

## 2016-04-17 ENCOUNTER — Encounter: Payer: Self-pay | Admitting: Internal Medicine

## 2016-06-06 ENCOUNTER — Other Ambulatory Visit: Payer: Self-pay | Admitting: Obstetrics and Gynecology

## 2016-06-06 DIAGNOSIS — Z124 Encounter for screening for malignant neoplasm of cervix: Secondary | ICD-10-CM | POA: Diagnosis not present

## 2016-06-06 DIAGNOSIS — Z01419 Encounter for gynecological examination (general) (routine) without abnormal findings: Secondary | ICD-10-CM | POA: Diagnosis not present

## 2016-06-07 LAB — CYTOLOGY - PAP

## 2016-06-14 DIAGNOSIS — M9905 Segmental and somatic dysfunction of pelvic region: Secondary | ICD-10-CM | POA: Diagnosis not present

## 2016-06-14 DIAGNOSIS — M7061 Trochanteric bursitis, right hip: Secondary | ICD-10-CM | POA: Diagnosis not present

## 2016-06-14 DIAGNOSIS — M9903 Segmental and somatic dysfunction of lumbar region: Secondary | ICD-10-CM | POA: Diagnosis not present

## 2016-06-26 DIAGNOSIS — M150 Primary generalized (osteo)arthritis: Secondary | ICD-10-CM | POA: Diagnosis not present

## 2016-06-26 DIAGNOSIS — M4690 Unspecified inflammatory spondylopathy, site unspecified: Secondary | ICD-10-CM | POA: Diagnosis not present

## 2016-06-26 DIAGNOSIS — M797 Fibromyalgia: Secondary | ICD-10-CM | POA: Diagnosis not present

## 2016-06-27 DIAGNOSIS — Z1231 Encounter for screening mammogram for malignant neoplasm of breast: Secondary | ICD-10-CM | POA: Diagnosis not present

## 2016-06-30 DIAGNOSIS — R922 Inconclusive mammogram: Secondary | ICD-10-CM | POA: Diagnosis not present

## 2016-07-05 DIAGNOSIS — M7061 Trochanteric bursitis, right hip: Secondary | ICD-10-CM | POA: Diagnosis not present

## 2016-07-05 DIAGNOSIS — M9905 Segmental and somatic dysfunction of pelvic region: Secondary | ICD-10-CM | POA: Diagnosis not present

## 2016-07-05 DIAGNOSIS — M9903 Segmental and somatic dysfunction of lumbar region: Secondary | ICD-10-CM | POA: Diagnosis not present

## 2016-07-07 DIAGNOSIS — M7061 Trochanteric bursitis, right hip: Secondary | ICD-10-CM | POA: Diagnosis not present

## 2016-07-07 DIAGNOSIS — M9905 Segmental and somatic dysfunction of pelvic region: Secondary | ICD-10-CM | POA: Diagnosis not present

## 2016-07-07 DIAGNOSIS — M9903 Segmental and somatic dysfunction of lumbar region: Secondary | ICD-10-CM | POA: Diagnosis not present

## 2016-07-18 DIAGNOSIS — M9903 Segmental and somatic dysfunction of lumbar region: Secondary | ICD-10-CM | POA: Diagnosis not present

## 2016-07-18 DIAGNOSIS — M9905 Segmental and somatic dysfunction of pelvic region: Secondary | ICD-10-CM | POA: Diagnosis not present

## 2016-07-18 DIAGNOSIS — M7061 Trochanteric bursitis, right hip: Secondary | ICD-10-CM | POA: Diagnosis not present

## 2016-08-01 DIAGNOSIS — M9903 Segmental and somatic dysfunction of lumbar region: Secondary | ICD-10-CM | POA: Diagnosis not present

## 2016-08-01 DIAGNOSIS — M9905 Segmental and somatic dysfunction of pelvic region: Secondary | ICD-10-CM | POA: Diagnosis not present

## 2016-08-01 DIAGNOSIS — M7061 Trochanteric bursitis, right hip: Secondary | ICD-10-CM | POA: Diagnosis not present

## 2016-08-16 DIAGNOSIS — M797 Fibromyalgia: Secondary | ICD-10-CM | POA: Diagnosis not present

## 2016-08-16 DIAGNOSIS — M47816 Spondylosis without myelopathy or radiculopathy, lumbar region: Secondary | ICD-10-CM | POA: Diagnosis not present

## 2016-08-30 ENCOUNTER — Encounter: Payer: Self-pay | Admitting: Internal Medicine

## 2016-09-13 DIAGNOSIS — M7061 Trochanteric bursitis, right hip: Secondary | ICD-10-CM | POA: Diagnosis not present

## 2016-09-13 DIAGNOSIS — M9903 Segmental and somatic dysfunction of lumbar region: Secondary | ICD-10-CM | POA: Diagnosis not present

## 2016-09-13 DIAGNOSIS — M9905 Segmental and somatic dysfunction of pelvic region: Secondary | ICD-10-CM | POA: Diagnosis not present

## 2016-09-29 DIAGNOSIS — M9905 Segmental and somatic dysfunction of pelvic region: Secondary | ICD-10-CM | POA: Diagnosis not present

## 2016-09-29 DIAGNOSIS — M7061 Trochanteric bursitis, right hip: Secondary | ICD-10-CM | POA: Diagnosis not present

## 2016-09-29 DIAGNOSIS — M9903 Segmental and somatic dysfunction of lumbar region: Secondary | ICD-10-CM | POA: Diagnosis not present

## 2016-10-17 DIAGNOSIS — M9905 Segmental and somatic dysfunction of pelvic region: Secondary | ICD-10-CM | POA: Diagnosis not present

## 2016-10-17 DIAGNOSIS — M7061 Trochanteric bursitis, right hip: Secondary | ICD-10-CM | POA: Diagnosis not present

## 2016-10-17 DIAGNOSIS — M9903 Segmental and somatic dysfunction of lumbar region: Secondary | ICD-10-CM | POA: Diagnosis not present

## 2016-11-15 DIAGNOSIS — M9905 Segmental and somatic dysfunction of pelvic region: Secondary | ICD-10-CM | POA: Diagnosis not present

## 2016-11-15 DIAGNOSIS — M9903 Segmental and somatic dysfunction of lumbar region: Secondary | ICD-10-CM | POA: Diagnosis not present

## 2016-11-15 DIAGNOSIS — M7061 Trochanteric bursitis, right hip: Secondary | ICD-10-CM | POA: Diagnosis not present

## 2016-12-07 ENCOUNTER — Encounter: Payer: Self-pay | Admitting: Internal Medicine

## 2016-12-13 DIAGNOSIS — M9903 Segmental and somatic dysfunction of lumbar region: Secondary | ICD-10-CM | POA: Diagnosis not present

## 2016-12-13 DIAGNOSIS — M7061 Trochanteric bursitis, right hip: Secondary | ICD-10-CM | POA: Diagnosis not present

## 2016-12-13 DIAGNOSIS — M9905 Segmental and somatic dysfunction of pelvic region: Secondary | ICD-10-CM | POA: Diagnosis not present

## 2016-12-18 ENCOUNTER — Encounter: Payer: Commercial Managed Care - HMO | Admitting: Internal Medicine

## 2016-12-19 ENCOUNTER — Encounter (HOSPITAL_COMMUNITY): Payer: Self-pay | Admitting: Emergency Medicine

## 2016-12-19 ENCOUNTER — Ambulatory Visit (HOSPITAL_COMMUNITY)
Admission: EM | Admit: 2016-12-19 | Discharge: 2016-12-19 | Disposition: A | Discharge status: Home / Self Care | Payer: 59

## 2016-12-19 ENCOUNTER — Ambulatory Visit (INDEPENDENT_AMBULATORY_CARE_PROVIDER_SITE_OTHER): Payer: 59

## 2016-12-19 DIAGNOSIS — M79641 Pain in right hand: Secondary | ICD-10-CM

## 2016-12-19 DIAGNOSIS — S6991XA Unspecified injury of right wrist, hand and finger(s), initial encounter: Secondary | ICD-10-CM | POA: Diagnosis not present

## 2016-12-19 IMAGING — DX DG HAND COMPLETE 3+V*R*
3 series · 3 of 3 positions shown · non-contrast
Comparison: None.

CLINICAL DATA: Fell on concrete.  First and second digit pain.

EXAM:
RIGHT HAND - COMPLETE 3+ VIEW

[hand pa]
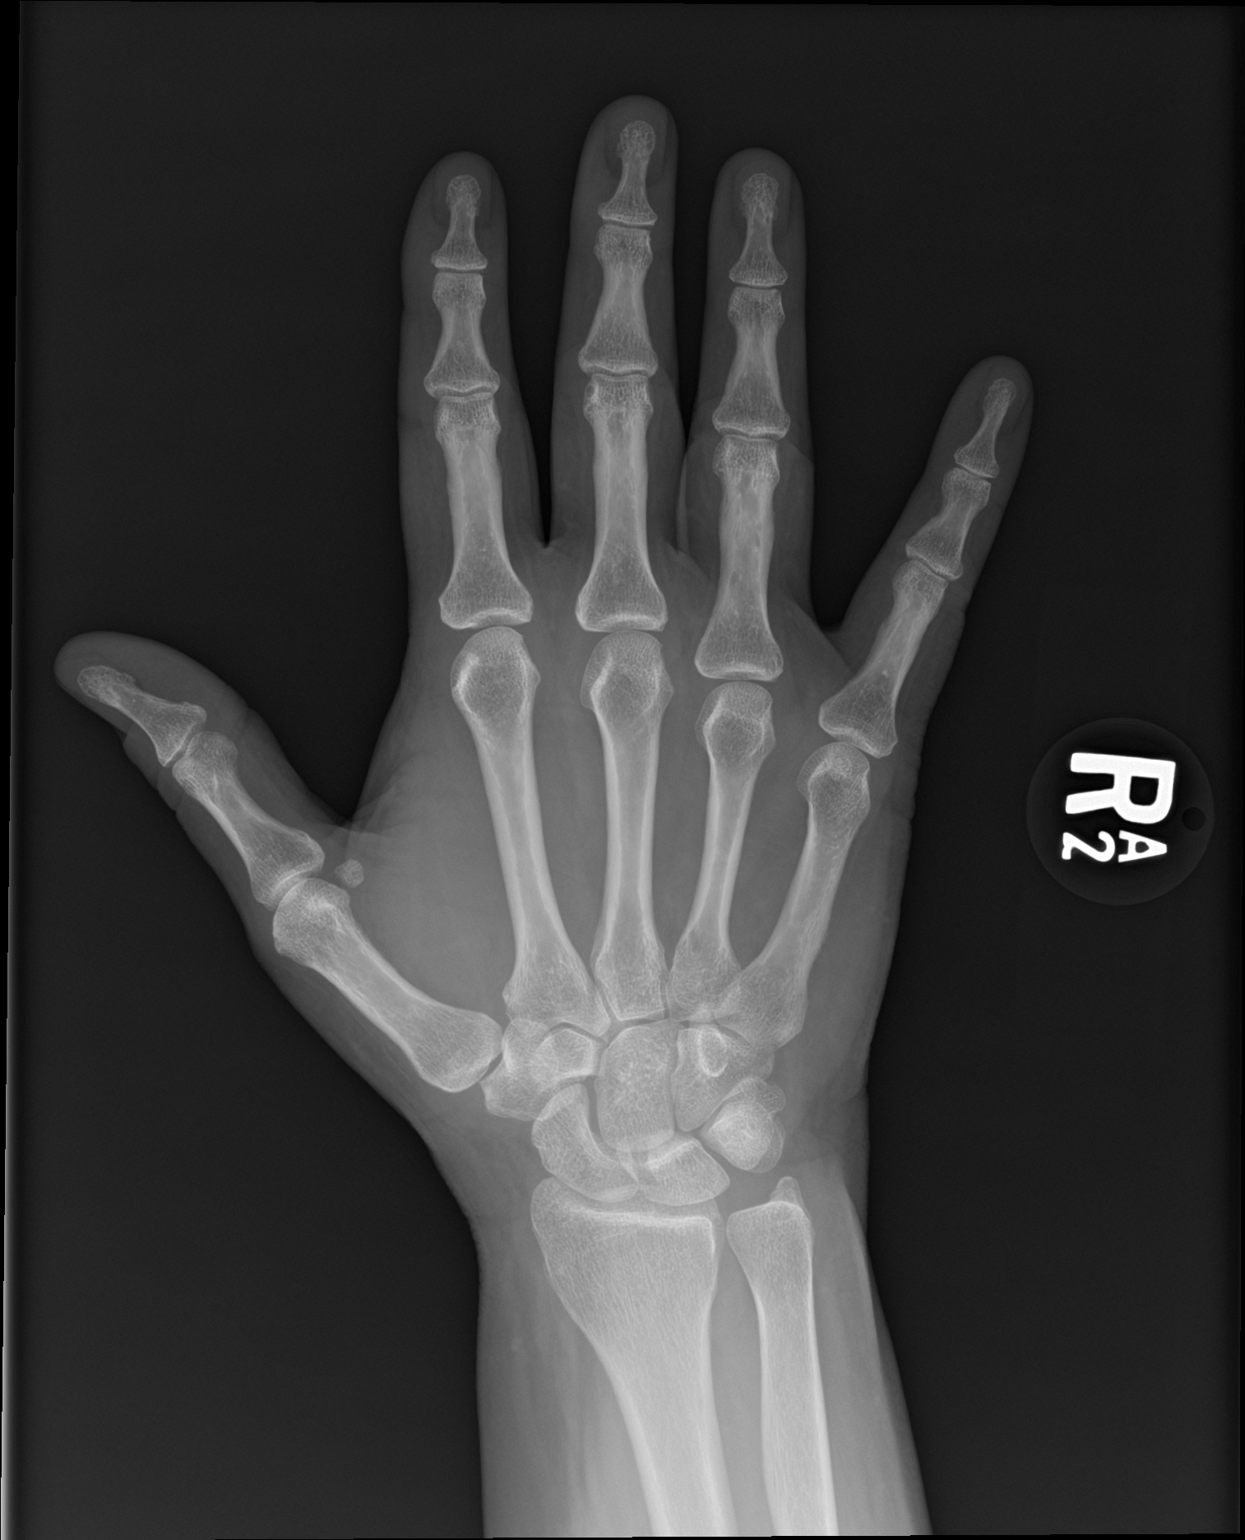

[hand obl]
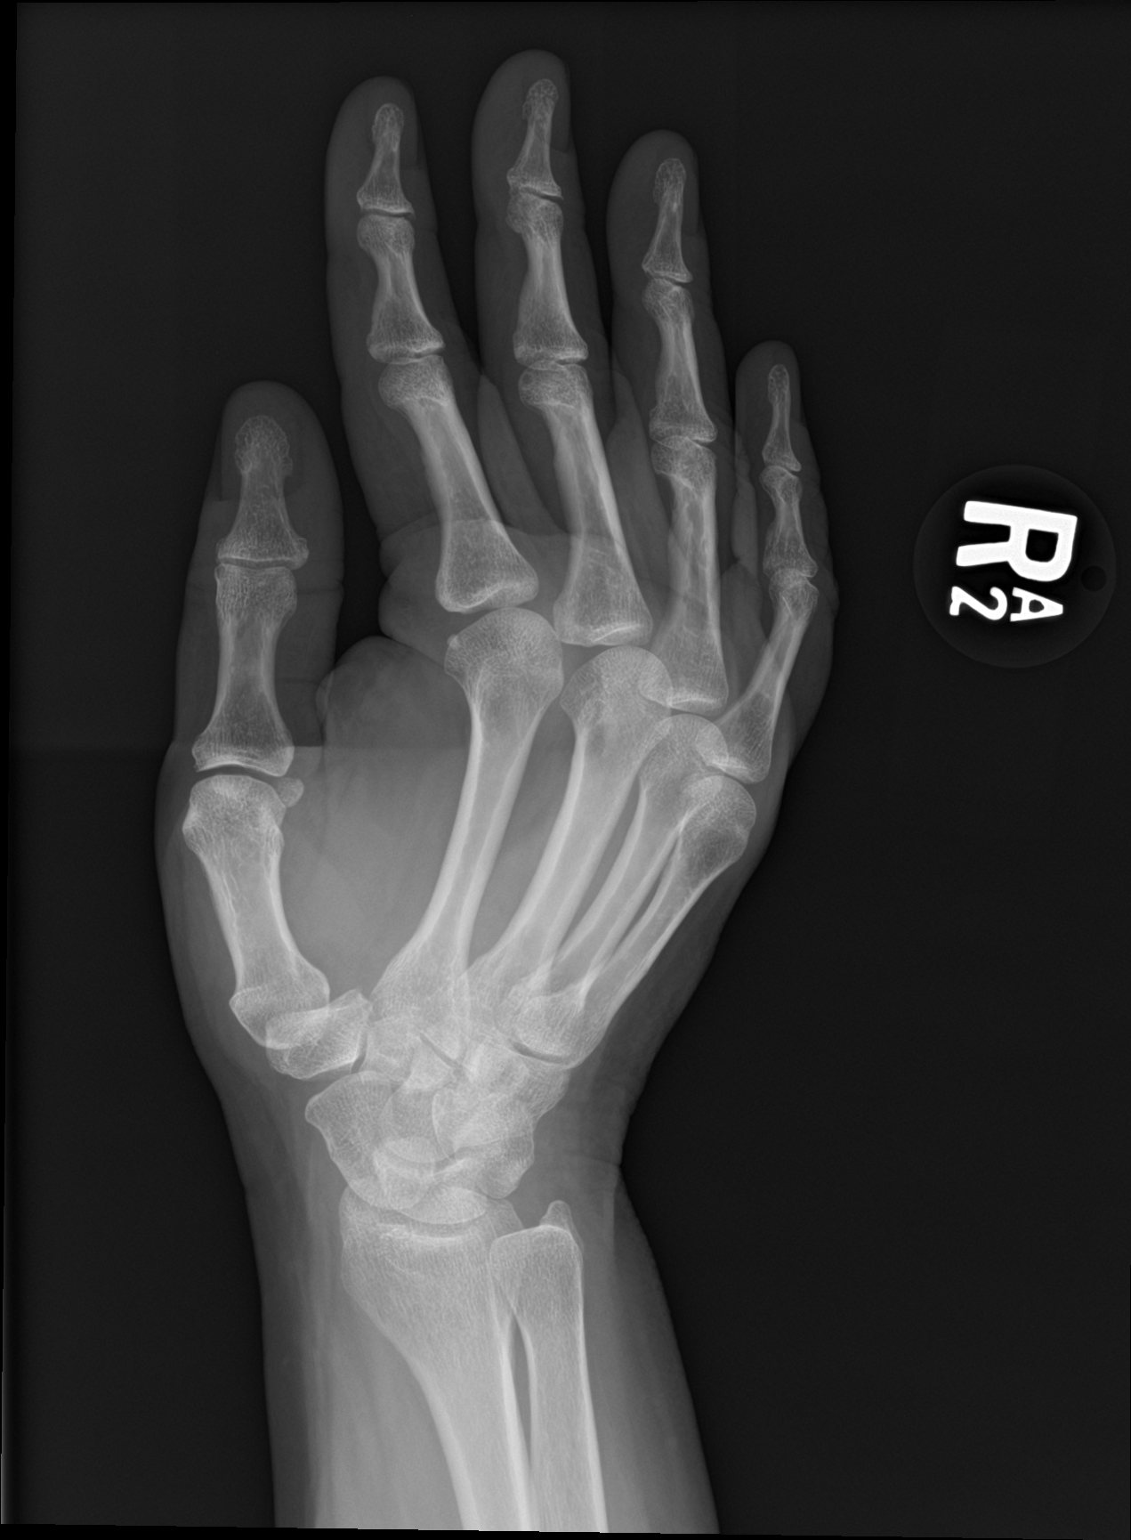

[hand lat]
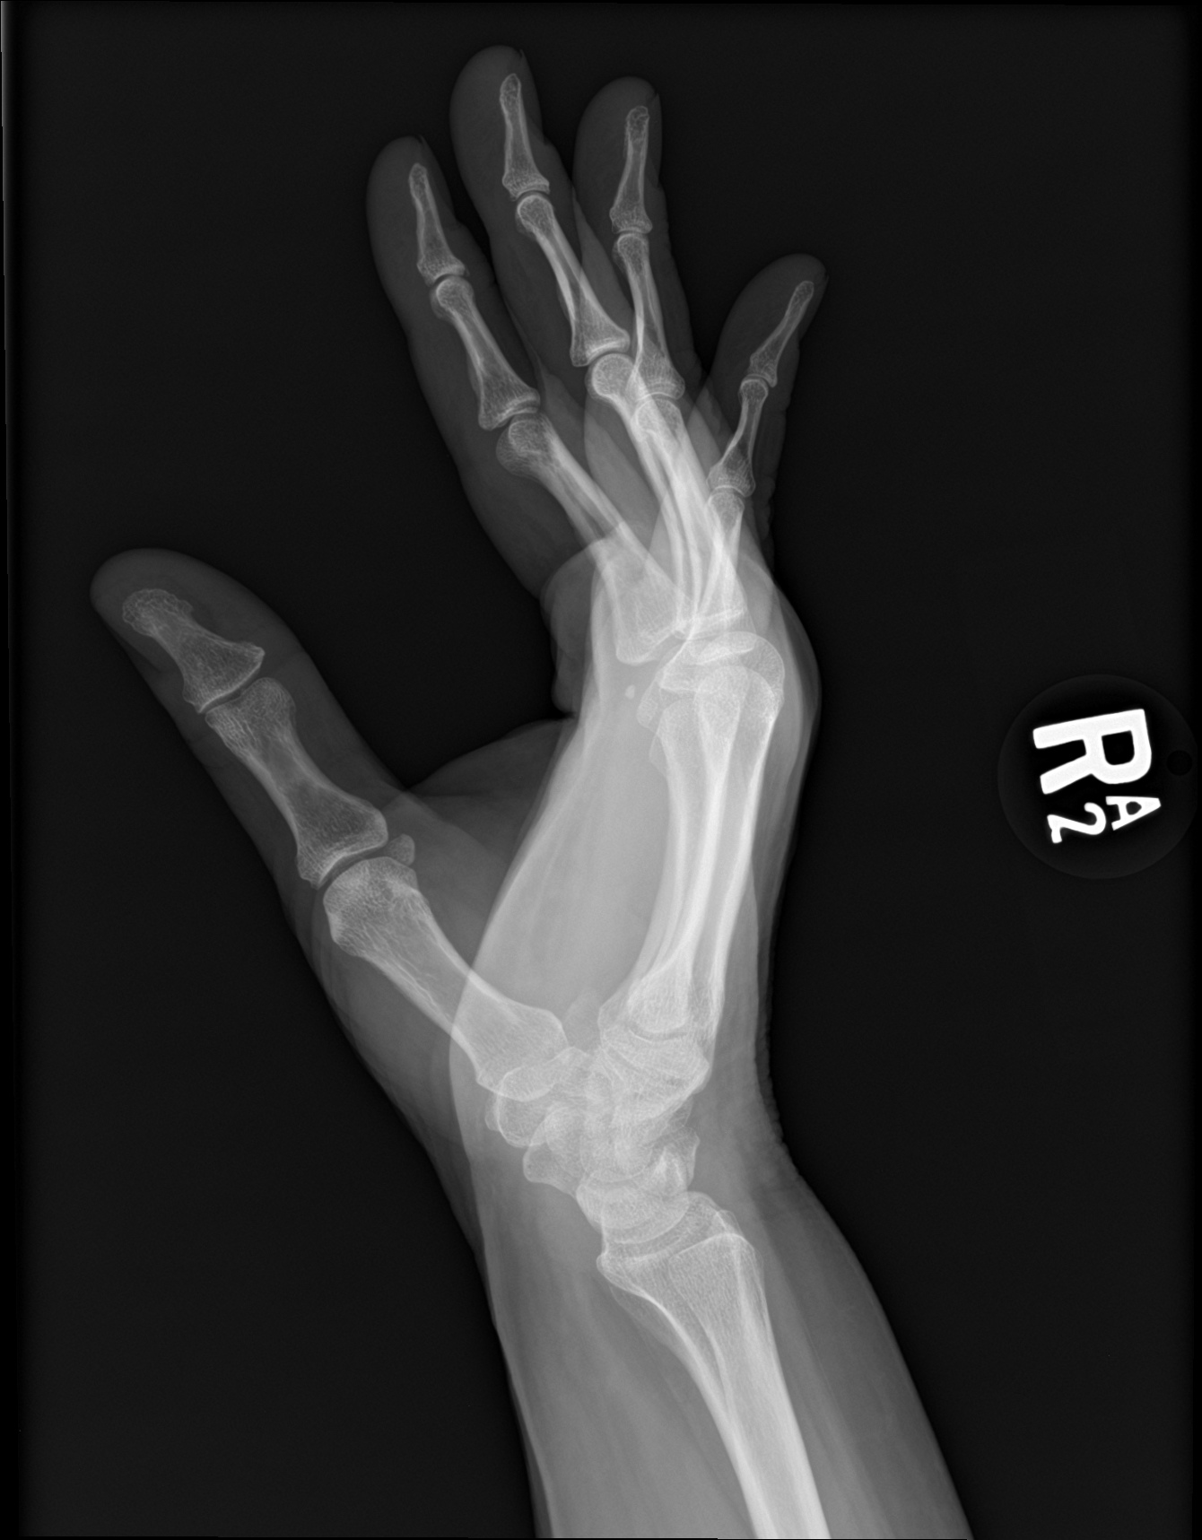

[3 of 3 positions shown; findings below may reference images not displayed]

FINDINGS: There is no evidence of fracture or dislocation. There is no
evidence of arthropathy or other focal bone abnormality. Soft
tissues are unremarkable.
IMPRESSION: Negative.

## 2016-12-19 MED ORDER — MELOXICAM 15 MG PO TABS: 15.0000 mg | ORAL_TABLET | Freq: Every day | ORAL | 0 refills | Status: DC

## 2016-12-19 NOTE — ED Triage Notes (Addendum)
[address] today in a parking lot, uneven concrete.  Landed with right hand wide open, landing on palm of hand.  No wrist pain.

## 2016-12-19 NOTE — Discharge Instructions (Signed)
No fracture or dislocation on Xray, most likely soft tissue injury. Recommend rest, ice, elevation, we have provided a splint, wear as needed for one week, may be off for showing and hygiene needs. I have included the name of an orthopedic hand specialist. Follow up with him as needed if symptoms persist.
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
## 2016-12-25 DIAGNOSIS — M4690 Unspecified inflammatory spondylopathy, site unspecified: Secondary | ICD-10-CM | POA: Diagnosis not present

## 2016-12-25 DIAGNOSIS — M797 Fibromyalgia: Secondary | ICD-10-CM | POA: Diagnosis not present

## 2016-12-25 DIAGNOSIS — M150 Primary generalized (osteo)arthritis: Secondary | ICD-10-CM | POA: Diagnosis not present

## 2016-12-26 DIAGNOSIS — M7061 Trochanteric bursitis, right hip: Secondary | ICD-10-CM | POA: Diagnosis not present

## 2016-12-26 DIAGNOSIS — M9905 Segmental and somatic dysfunction of pelvic region: Secondary | ICD-10-CM | POA: Diagnosis not present

## 2016-12-26 DIAGNOSIS — M9903 Segmental and somatic dysfunction of lumbar region: Secondary | ICD-10-CM | POA: Diagnosis not present

## 2017-01-15 DIAGNOSIS — M9905 Segmental and somatic dysfunction of pelvic region: Secondary | ICD-10-CM | POA: Diagnosis not present

## 2017-01-15 DIAGNOSIS — M9903 Segmental and somatic dysfunction of lumbar region: Secondary | ICD-10-CM | POA: Diagnosis not present

## 2017-01-15 DIAGNOSIS — M7061 Trochanteric bursitis, right hip: Secondary | ICD-10-CM | POA: Diagnosis not present

## 2017-01-22 ENCOUNTER — Encounter: Payer: Self-pay | Admitting: Internal Medicine

## 2017-01-24 DIAGNOSIS — M79641 Pain in right hand: Secondary | ICD-10-CM | POA: Diagnosis not present

## 2017-01-24 DIAGNOSIS — R52 Pain, unspecified: Secondary | ICD-10-CM | POA: Diagnosis not present

## 2017-02-01 DIAGNOSIS — M7061 Trochanteric bursitis, right hip: Secondary | ICD-10-CM | POA: Diagnosis not present

## 2017-02-01 DIAGNOSIS — M9903 Segmental and somatic dysfunction of lumbar region: Secondary | ICD-10-CM | POA: Diagnosis not present

## 2017-02-01 DIAGNOSIS — M9905 Segmental and somatic dysfunction of pelvic region: Secondary | ICD-10-CM | POA: Diagnosis not present

## 2017-02-16 ENCOUNTER — Encounter: Payer: Commercial Managed Care - HMO | Admitting: Internal Medicine

## 2017-02-16 DIAGNOSIS — M9903 Segmental and somatic dysfunction of lumbar region: Secondary | ICD-10-CM | POA: Diagnosis not present

## 2017-02-16 DIAGNOSIS — M9905 Segmental and somatic dysfunction of pelvic region: Secondary | ICD-10-CM | POA: Diagnosis not present

## 2017-02-16 DIAGNOSIS — M7061 Trochanteric bursitis, right hip: Secondary | ICD-10-CM | POA: Diagnosis not present

## 2017-03-08 ENCOUNTER — Ambulatory Visit (AMBULATORY_SURGERY_CENTER): Payer: Self-pay | Admitting: *Deleted

## 2017-03-08 VITALS — Ht 68.0 in | Wt 205.4 lb

## 2017-03-08 DIAGNOSIS — Z860100 Personal history of colon polyps, unspecified: Secondary | ICD-10-CM

## 2017-03-08 MED ORDER — SUPREP BOWEL PREP KIT 17.5-3.13-1.6 GM/177ML PO SOLN: 1.0000 | Freq: Once | ORAL | 0 refills | Status: AC

## 2017-03-08 NOTE — Progress Notes (Signed)
Patient denies any allergies to egg or soy products. Patient denies complications with anesthesia/sedation.  Patient denies oxygen use at home and denies diet medications. Colonoscopy explained and pamphlet given to patient.

## 2017-03-09 ENCOUNTER — Encounter: Payer: Self-pay | Admitting: Internal Medicine

## 2017-03-22 ENCOUNTER — Ambulatory Visit (AMBULATORY_SURGERY_CENTER): Payer: 59 | Admitting: Internal Medicine

## 2017-03-22 ENCOUNTER — Encounter: Payer: Self-pay | Admitting: Internal Medicine

## 2017-03-22 VITALS — BP 149/90 | HR 72 | Temp 99.5°F | Resp 19 | Ht 68.0 in | Wt 205.0 lb

## 2017-03-22 DIAGNOSIS — D122 Benign neoplasm of ascending colon: Secondary | ICD-10-CM | POA: Diagnosis not present

## 2017-03-22 DIAGNOSIS — D124 Benign neoplasm of descending colon: Secondary | ICD-10-CM

## 2017-03-22 DIAGNOSIS — Z860100 Personal history of colon polyps, unspecified: Secondary | ICD-10-CM

## 2017-03-22 DIAGNOSIS — Z1211 Encounter for screening for malignant neoplasm of colon: Secondary | ICD-10-CM | POA: Diagnosis not present

## 2017-03-22 MED ORDER — SODIUM CHLORIDE 0.9 % IV SOLN: 500.0000 mL | INTRAVENOUS | Status: DC

## 2017-03-22 NOTE — Op Note (Signed)
[facility] Patient Name: Tonya Elliott Procedure Date: 03/22/2017 1:48 PM MRN: [id number] Endoscopist: Docia Chuck. Henrene Pastor , MD Age: 51 Referring MD:  Date of Birth: Oct 14, 1965 Gender: Female Account #: [id number] Procedure:                Colonoscopy, with cold snare polypectomy x 4 Indications:              High risk colon cancer surveillance: Personal                            history of adenoma (10 mm or greater in size) Medicines:                Monitored Anesthesia Care Procedure:                Pre-Anesthesia Assessment:                           - Prior to the procedure, a History and Physical                            was performed, and patient medications and                            allergies were reviewed. The patient's tolerance of                            previous anesthesia was also reviewed. The risks                            and benefits of the procedure and the sedation                            options and risks were discussed with the patient.                            All questions were answered, and informed consent                            was obtained. Prior Anticoagulants: The patient has                            taken no previous anticoagulant or antiplatelet                            agents. ASA Grade Assessment: II - A patient with                            mild systemic disease. After reviewing the risks                            and benefits, the patient was deemed in                            satisfactory condition to undergo the procedure.  After obtaining informed consent, the colonoscope                            was passed under direct vision. Throughout the                            procedure, the patient's blood pressure, pulse, and                            oxygen saturations were monitored continuously. The                            Endoscope was introduced through the anus and           advanced to the the cecum, identified by                            appendiceal orifice and ileocecal valve. The                            ileocecal valve, appendiceal orifice, and rectum                            were photographed. The quality of the bowel                            preparation was excellent. The colonoscopy was                            performed without difficulty. The patient tolerated                            the procedure well. The bowel preparation used was                            SUPREP. Scope In: 2:28:45 PM Scope Out: 3:04:31 PM Scope Withdrawal Time: 0 hours 29 minutes 20 seconds  Total Procedure Duration: 0 hours 35 minutes 46 seconds  Findings:                 The previous marking tattoo was identified.                            Proximal to this was a large area of scar tissue.                            Within this was approximately 7 mm of adenomatous                            appearing tissue surrounded by scar in the                            descending colon. The polypoid tissue was sessile.  The polyp was removed with a cold snare. Hot snare                            tip cautery was used cauterize the central portion                            post polypectomy. The resection was felt to be                            complete and retrieved tissue sent for pathologic                            analysis.                           Three sessile polyps were found in the descending                            colon and ascending colon. The polyps were 3 to 5                            mm in size. These polyps were removed with a cold                            snare. Resection and retrieval were complete.                           The exam was otherwise without abnormality on                            direct and retroflexion views. Complications:            No immediate complications. Estimated blood loss:                             None. Estimated Blood Loss:     Estimated blood loss: none. Impression:               - One 7 mm polyp in the descending colon in prior                            piecemeal polypectomy bed, removed with a cold                            snare, and touched up with cautery. Resected and                            retrieved.                           - Three 3 to 5 mm polyps in the descending colon                            and in the ascending colon, removed with a  cold                            snare. Resected and retrieved.                           - The examination was otherwise normal on direct                            and retroflexion views. Recommendation:           - Repeat colonoscopy in 1 year for surveillance.                           - Patient has a contact number available for                            emergencies. The signs and symptoms of potential                            delayed complications were discussed with the                            patient. Return to normal activities tomorrow.                            Written discharge instructions were provided to the                            patient.                           - Resume previous diet.                           - Continue present medications.                           - Await pathology results. Docia Chuck. Henrene Pastor, MD 03/22/2017 3:20:45 PM This report has been signed electronically.

## 2017-03-22 NOTE — Progress Notes (Signed)
Called to room to assist during endoscopic procedure.  Patient ID and intended procedure confirmed with present staff. Received instructions for my participation in the procedure from the performing physician.  

## 2017-03-22 NOTE — Progress Notes (Signed)
To PACU, VSS. Report to [email or web address] 

## 2017-03-22 NOTE — Progress Notes (Signed)
Pt's states no medical or surgical changes since previsit or office visit. 

## 2017-03-22 NOTE — Patient Instructions (Signed)
  Handout given : Polyps   YOU HAD AN ENDOSCOPIC PROCEDURE TODAY AT [facility]:   Refer to the procedure report that was given to you for any specific questions about what was found during the examination.  If the procedure report does not answer your questions, please call your gastroenterologist to clarify.  If you requested that your care partner not be given the details of your procedure findings, then the procedure report has been included in a sealed envelope for you to review at your convenience later.  YOU SHOULD EXPECT: Some feelings of bloating in the abdomen. Passage of more gas than usual.  Walking can help get rid of the air that was put into your GI tract during the procedure and reduce the bloating. If you had a lower endoscopy (such as a colonoscopy or flexible sigmoidoscopy) you may notice spotting of blood in your stool or on the toilet paper. If you underwent a bowel prep for your procedure, you may not have a normal bowel movement for a few days.  Please Note:  You might notice some irritation and congestion in your nose or some drainage.  This is from the oxygen used during your procedure.  There is no need for concern and it should clear up in a day or so.  SYMPTOMS TO REPORT IMMEDIATELY:   Following lower endoscopy (colonoscopy or flexible sigmoidoscopy):  Excessive amounts of blood in the stool  Significant tenderness or worsening of abdominal pains  Swelling of the abdomen that is new, acute  Fever of 100F or higher  For urgent or emergent issues, a gastroenterologist can be reached at any hour by calling [phone].   DIET:  We do recommend a small meal at first, but then you may proceed to your regular diet.  Drink plenty of fluids but you should avoid alcoholic beverages for 24 hours.  ACTIVITY:  You should plan to take it easy for the rest of today and you should NOT DRIVE or use heavy machinery until tomorrow (because of the sedation  medicines used during the test).    FOLLOW UP: Our staff will call the number listed on your records the next business day following your procedure to check on you and address any questions or concerns that you may have regarding the information given to you following your procedure. If we do not reach you, we will leave a message.  However, if you are feeling well and you are not experiencing any problems, there is no need to return our call.  We will assume that you have returned to your regular daily activities without incident.  If any biopsies were taken you will be contacted by phone or by letter within the next 1-3 weeks.  Please call us at [phone] if you have not heard about the biopsies in 3 weeks.    SIGNATURES/CONFIDENTIALITY: You and/or your care partner have signed paperwork which will be entered into your electronic medical record.  These signatures attest to the fact that that the information above on your After Visit Summary has been reviewed and is understood.  Full responsibility of the confidentiality of this discharge information lies with you and/or your care-partner.

## 2017-03-23 ENCOUNTER — Telehealth: Payer: Self-pay

## 2017-03-23 NOTE — Telephone Encounter (Signed)
  Follow up Call-  Call Rogue Pautler number 03/22/2017 04/07/2016  Post procedure Call Aziah Kaiser phone  # [phone]  Permission to leave phone message Yes Yes  Some recent data might be hidden     Patient questions:  Do you have a fever, pain , or abdominal swelling? No. Pain Score  0 *  Have you tolerated food without any problems? Yes.    Have you been able to return to your normal activities? Yes.    Do you have any questions about your discharge instructions: Diet   No. Medications  No. Follow up visit  No.  Do you have questions or concerns about your Care? No.  Actions: * If pain score is 4 or above: No action needed, pain <4.

## 2017-03-27 ENCOUNTER — Encounter: Payer: Self-pay | Admitting: Internal Medicine

## 2017-03-27 DIAGNOSIS — Z0000 Encounter for general adult medical examination without abnormal findings: Secondary | ICD-10-CM | POA: Diagnosis not present

## 2017-04-02 DIAGNOSIS — M459 Ankylosing spondylitis of unspecified sites in spine: Secondary | ICD-10-CM | POA: Diagnosis not present

## 2017-04-02 DIAGNOSIS — Z23 Encounter for immunization: Secondary | ICD-10-CM | POA: Diagnosis not present

## 2017-04-02 DIAGNOSIS — E781 Pure hyperglyceridemia: Secondary | ICD-10-CM | POA: Diagnosis not present

## 2017-04-02 DIAGNOSIS — M797 Fibromyalgia: Secondary | ICD-10-CM | POA: Diagnosis not present

## 2017-04-02 DIAGNOSIS — Z0000 Encounter for general adult medical examination without abnormal findings: Secondary | ICD-10-CM | POA: Diagnosis not present

## 2017-05-07 ENCOUNTER — Encounter: Payer: Self-pay | Admitting: Neurology

## 2017-05-08 ENCOUNTER — Ambulatory Visit: Payer: 59 | Admitting: Neurology

## 2017-05-08 ENCOUNTER — Encounter: Payer: Self-pay | Admitting: Neurology

## 2017-05-08 ENCOUNTER — Encounter (INDEPENDENT_AMBULATORY_CARE_PROVIDER_SITE_OTHER): Payer: Self-pay

## 2017-05-08 VITALS — BP 151/91 | HR 83 | Ht 68.0 in | Wt 203.0 lb

## 2017-05-08 DIAGNOSIS — M62838 Other muscle spasm: Secondary | ICD-10-CM | POA: Diagnosis not present

## 2017-05-08 DIAGNOSIS — M791 Myalgia, unspecified site: Secondary | ICD-10-CM

## 2017-05-08 DIAGNOSIS — M450 Ankylosing spondylitis of multiple sites in spine: Secondary | ICD-10-CM | POA: Diagnosis not present

## 2017-05-08 DIAGNOSIS — Z789 Other specified health status: Secondary | ICD-10-CM

## 2017-05-08 DIAGNOSIS — R0683 Snoring: Secondary | ICD-10-CM

## 2017-05-08 DIAGNOSIS — T481X5A Adverse effect of skeletal muscle relaxants [neuromuscular blocking agents], initial encounter: Secondary | ICD-10-CM

## 2017-05-08 DIAGNOSIS — G4761 Periodic limb movement disorder: Secondary | ICD-10-CM | POA: Diagnosis not present

## 2017-05-08 NOTE — Patient Instructions (Signed)
Hypersomnia Hypersomnia is when you feel extremely tired during the day even though you're getting plenty of sleep at night. You may need to take naps during the day, and you may also be extremely difficult to wake up when you are sleeping. What are the causes? The cause of your hypersomnia may not be known. Hypersomnia may be caused by:  Medicines.  Sleep disorders, such as narcolepsy.  Trauma or injury to your head or nervous system.  Using drugs or alcohol.  Tumors.  Medical conditions, such as depression or hypothyroidism.  Genetics.  What are the signs or symptoms? The main symptoms of hypersomnia include:  Feeling extremely tired throughout the day.  Being very difficult to wake up.  Sleeping for longer and longer periods.  Taking naps throughout the day.  Other symptoms may include:  Feeling: ? Restless. ? Annoyed. ? Anxious. ? Low energy.  Having difficulty: ? Remembering. ? Speaking. ? Thinking.  Losing your appetite.  Experiencing hallucinations.  How is this diagnosed? Hypersomnia may be diagnosed by:  Medical history and physical exam. This will include a sleep history.  Completing sleep logs.  Tests may also be done, such as: ? Polysomnography. ? Multiple sleep latency test (MSLT).  How is this treated? There is no cure for hypersomnia, but treatment can be very effective in helping manage the condition. Treatment may include:  Lifestyle and sleeping strategies to help cope with the condition.  Stimulant medicines.  Treating any underlying causes of hypersomnia.  Follow these instructions at home:  Take medicines only as directed by your health care provider.  Schedule short naps for when you feel sleepiest during the day. Tell your employer or teachers that you have hypersomnia. You may be able to adjust your schedule to include time for naps.  Avoid drinking alcohol or caffeinated beverages.  Do not eat a heavy meal before  bedtime. Eat at about the same times every day.  Do not drive or operate heavy machinery if you are sleepy.  Do not swim or go out on the water without a life jacket.  If possible, adjust your schedule so that you do not have to work or be active at night.  Keep all follow-up visits as directed by your health care provider. This is important. Contact a health care provider if:  You have new symptoms.  Your symptoms get worse. Get help right away if: You have serious thoughts of hurting yourself or someone else. This information is not intended to replace advice given to you by your health care provider. Make sure you discuss any questions you have with your health care provider. Document Released: 05/12/2002 Document Revised: 10/28/2015 Document Reviewed: 12/25/2013 [employer] Interactive Patient Education  2018 [employer]  
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
## 2017-05-17 ENCOUNTER — Telehealth: Payer: Self-pay

## 2017-05-17 DIAGNOSIS — G4733 Obstructive sleep apnea (adult) (pediatric): Secondary | ICD-10-CM

## 2017-05-17 NOTE — Telephone Encounter (Signed)
[employer] denied in-lab study. They do not approve in-lab if patient is on narcotics anymore. Need a HST order. Thanks

## 2017-06-01 DIAGNOSIS — M9901 Segmental and somatic dysfunction of cervical region: Secondary | ICD-10-CM | POA: Diagnosis not present

## 2017-06-01 DIAGNOSIS — M9903 Segmental and somatic dysfunction of lumbar region: Secondary | ICD-10-CM | POA: Diagnosis not present

## 2017-06-01 DIAGNOSIS — M9905 Segmental and somatic dysfunction of pelvic region: Secondary | ICD-10-CM | POA: Diagnosis not present

## 2017-06-06 ENCOUNTER — Ambulatory Visit (INDEPENDENT_AMBULATORY_CARE_PROVIDER_SITE_OTHER): Payer: 59 | Admitting: Neurology

## 2017-06-06 DIAGNOSIS — G4733 Obstructive sleep apnea (adult) (pediatric): Secondary | ICD-10-CM

## 2017-06-15 DIAGNOSIS — M9903 Segmental and somatic dysfunction of lumbar region: Secondary | ICD-10-CM | POA: Diagnosis not present

## 2017-06-15 DIAGNOSIS — M9901 Segmental and somatic dysfunction of cervical region: Secondary | ICD-10-CM | POA: Diagnosis not present

## 2017-06-15 DIAGNOSIS — M9905 Segmental and somatic dysfunction of pelvic region: Secondary | ICD-10-CM | POA: Diagnosis not present

## 2017-06-18 ENCOUNTER — Other Ambulatory Visit: Payer: Self-pay | Admitting: Neurology

## 2017-06-18 DIAGNOSIS — R0683 Snoring: Secondary | ICD-10-CM

## 2017-06-18 DIAGNOSIS — M791 Myalgia, unspecified site: Secondary | ICD-10-CM

## 2017-06-18 DIAGNOSIS — M62838 Other muscle spasm: Secondary | ICD-10-CM

## 2017-06-18 DIAGNOSIS — M450 Ankylosing spondylitis of multiple sites in spine: Secondary | ICD-10-CM

## 2017-06-18 DIAGNOSIS — T481X5A Adverse effect of skeletal muscle relaxants [neuromuscular blocking agents], initial encounter: Secondary | ICD-10-CM

## 2017-06-18 DIAGNOSIS — G4761 Periodic limb movement disorder: Secondary | ICD-10-CM

## 2017-06-18 NOTE — Procedures (Signed)
[facility] [address] [place], [place] [address] NAME: Tonya Elliott                                                         DOB: 06-15-1965 MEDICAL RECORD No:  [id number]                                   DOS: 06/06/2017 REFERRING PHYSICIAN: Marton Redwood, M.D.  STUDY PERFORMED: HST, by apnea link   HISTORY:  Patient Tonya Elliott is a 52 year old Caucasian, right-handed, and married Female who is seen here for evaluation of sleep quality, architecture, and causes of excessive daytime sleepiness (EDS). She felt her sleep is no longer restorative and refreshing, and has been told that she snores. She feels significantly more fatigued, and less excessively daytime sleepy.  She also reports aching muscles, joints and back pain- and carries a diagnosis of ankylosing spondylitis. She does have neither diabetes, nor hypertension, no heart disease of any kind.   She rises at 7 AM and estimates an average of 7-9 hours of nocturnal sleep.  Does sleep alone, because of her nocturnal restlessness and snoring which bothered her spouse.  She does also have back pain and stiffness related to her ankylosing spondylitis. Epworth Sleepiness Score endorsed at 14/24 points, Fatigue severity score at 63- the highest score possible. Her BMI is 30.8.     STUDY RESULTS: Total Recording Time: 7 hours, 59 minutes Total Apnea/Hypopnea Index (AHI): 3.2 /hr. RDI was 5.9/hr.  Average Oxygen Saturation: 94%: Lowest Oxygen Desaturation: 82 %  Total Time Oxygen Saturation Below 89% Sp02: 6.0 min.  Average Heart Rate: 77 bpm in NSR (64-103 bpm)   IMPRESSION:  There is not enough apnea recorded to explain her daytime sleepiness and fatigue, neither is there excessive snoring or hypoxemia of clinical significance.  RECOMMENDATION: Her restlessness is most likely related to pain. PLMs cannot be evaluated in an unattended HST. Autoimmune diseases are causing fatigue and daytime sleepiness  without other primary sleep disorders. I will offer a repeat sleep study- attended in lab- if the patient feels that this HST did not reflect her normal nightly sleep.  I certify that I have reviewed the raw data recording prior to the issuance of this report in accordance with the standards of the American Academy of Sleep Medicine (AASM).  Larey Seat, M.D.     06-18-2017  Medical Director of [place] [facility], [place] of the ABPN and ABSM, and accredited by the AASM

## 2017-06-20 ENCOUNTER — Telehealth: Payer: Self-pay

## 2017-06-20 NOTE — Telephone Encounter (Signed)
I called pt. I advised her that there is not enough apnea recorded in her HST to explain her daytime sleepiness and  Fatigue, and neither is there excessive snoring or hypoxemia of clinical significance. Dr. Brett Fairy recommends an attended sleep study if pt feels that the HST did not reflect her normal nightly sleep. Pt's restlessness is most likely related to her pain and PLMs cannot be evaluated in an unattended HST. Pt is agreeable to an attended PSG if her insurance will allow for it. Will send to our sleep lab manager for further review. Pt verbalized understanding of results. Pt had no questions at this time but was encouraged to call back if questions arise.

## 2017-06-20 NOTE — Telephone Encounter (Signed)
-----   Message from Larey Seat, MD sent at 06/18/2017 10:24 AM EST ----- IMPRESSION: There is not enough apnea recorded to explain her daytime sleepiness and fatigue, neither is there excessive snoring or hypoxemia of clinical significance.   RECOMMENDATION: Her restlessness is most likely related to pain. PLMs cannot be evaluated in an unattended HST. Autoimmune  diseases are causing fatigue and daytime sleepiness without other primary sleep disorders. I will offer a repeat sleep study- attended in lab- if the patient feels that this HST did not reflect her normal nightly sleep.

## 2017-06-27 DIAGNOSIS — M4690 Unspecified inflammatory spondylopathy, site unspecified: Secondary | ICD-10-CM | POA: Diagnosis not present

## 2017-06-27 DIAGNOSIS — M150 Primary generalized (osteo)arthritis: Secondary | ICD-10-CM | POA: Diagnosis not present

## 2017-06-27 DIAGNOSIS — M797 Fibromyalgia: Secondary | ICD-10-CM | POA: Diagnosis not present

## 2017-06-29 DIAGNOSIS — M9903 Segmental and somatic dysfunction of lumbar region: Secondary | ICD-10-CM | POA: Diagnosis not present

## 2017-06-29 DIAGNOSIS — M9901 Segmental and somatic dysfunction of cervical region: Secondary | ICD-10-CM | POA: Diagnosis not present

## 2017-06-29 DIAGNOSIS — M9905 Segmental and somatic dysfunction of pelvic region: Secondary | ICD-10-CM | POA: Diagnosis not present

## 2017-07-02 DIAGNOSIS — Z1231 Encounter for screening mammogram for malignant neoplasm of breast: Secondary | ICD-10-CM | POA: Diagnosis not present

## 2017-07-11 DIAGNOSIS — M9905 Segmental and somatic dysfunction of pelvic region: Secondary | ICD-10-CM | POA: Diagnosis not present

## 2017-07-11 DIAGNOSIS — M9903 Segmental and somatic dysfunction of lumbar region: Secondary | ICD-10-CM | POA: Diagnosis not present

## 2017-07-11 DIAGNOSIS — M9901 Segmental and somatic dysfunction of cervical region: Secondary | ICD-10-CM | POA: Diagnosis not present

## 2017-07-24 ENCOUNTER — Ambulatory Visit (INDEPENDENT_AMBULATORY_CARE_PROVIDER_SITE_OTHER): Payer: 59 | Admitting: Neurology

## 2017-07-24 DIAGNOSIS — M791 Myalgia, unspecified site: Secondary | ICD-10-CM

## 2017-07-24 DIAGNOSIS — T481X5A Adverse effect of skeletal muscle relaxants [neuromuscular blocking agents], initial encounter: Secondary | ICD-10-CM

## 2017-07-24 DIAGNOSIS — G4761 Periodic limb movement disorder: Secondary | ICD-10-CM

## 2017-07-24 DIAGNOSIS — R0683 Snoring: Secondary | ICD-10-CM

## 2017-07-24 DIAGNOSIS — M450 Ankylosing spondylitis of multiple sites in spine: Secondary | ICD-10-CM

## 2017-07-24 DIAGNOSIS — Z01419 Encounter for gynecological examination (general) (routine) without abnormal findings: Secondary | ICD-10-CM | POA: Diagnosis not present

## 2017-07-24 DIAGNOSIS — Z124 Encounter for screening for malignant neoplasm of cervix: Secondary | ICD-10-CM | POA: Diagnosis not present

## 2017-07-27 ENCOUNTER — Other Ambulatory Visit: Payer: Self-pay | Admitting: Neurology

## 2017-07-27 DIAGNOSIS — G4734 Idiopathic sleep related nonobstructive alveolar hypoventilation: Secondary | ICD-10-CM

## 2017-07-27 DIAGNOSIS — G4761 Periodic limb movement disorder: Secondary | ICD-10-CM

## 2017-07-27 DIAGNOSIS — M62838 Other muscle spasm: Secondary | ICD-10-CM

## 2017-07-27 DIAGNOSIS — T481X5A Adverse effect of skeletal muscle relaxants [neuromuscular blocking agents], initial encounter: Secondary | ICD-10-CM

## 2017-07-27 DIAGNOSIS — Z789 Other specified health status: Secondary | ICD-10-CM

## 2017-07-27 DIAGNOSIS — M450 Ankylosing spondylitis of multiple sites in spine: Secondary | ICD-10-CM

## 2017-07-27 DIAGNOSIS — R0683 Snoring: Secondary | ICD-10-CM

## 2017-07-27 NOTE — Procedures (Signed)
PATIENT'S NAME:  Lucca, Greggs DOB:      Feb 05, 1966      MR#:    [id number]     DATE OF RECORDING: 07/24/2017 REFERRING M.D.:  Assunta Curtis, M.D. Study Performed:   Baseline Polysomnogram  This is a follow up study from HST (apnea link) on 06-06-2017, which revealed an AHI of 3.2/hr. and trivial hypoxemia. HISTORY:  ADALYNE LOVICK is a 52 year old Caucasian, right-handed, married Female patient who is seen here for evaluation of sleep quality, architecture, and causes of excessive daytime sleepiness (EDS). She felt her sleep is no longer restorative and refreshing, and has been told that she snores. She feels significantly more fatigued than sleepy.  She also reports aching muscles, joints and back pain- and carries a diagnosis of ankylosing spondylitis. She does have neither diabetes, nor hypertension, no heart disease of any kind.   She rises at 7 AM and estimates an average of 7-9 hours of nocturnal sleep.  Does sleep alone, because of her nocturnal restlessness and snoring which bothered her spouse.  She does also have back pain and stiffness related to her ankylosing spondylitis. Epworth Sleepiness Score endorsed at 14/24 points, Fatigue severity score at 63- the highest score possible. Her BMI is 30.7.  She takes medication that is sedating.  The patient's weight 203 pounds with a height of 68 (inches), resulting in a BMI of 30.7 kg/m2. The patient's neck circumference measured 15.2 inches.  CURRENT MEDICATIONS: Cyclobenzaprine, Diclofenac sodium, Duloxetine and Hydrocodone.   PROCEDURE:  This is a multichannel digital polysomnogram utilizing the Somnostar 11.2 system.  Electrodes and sensors were applied and monitored per AASM Specifications.   EEG, EOG, Chin and Limb EMG, were sampled at 200 Hz.  ECG, Snore and Nasal Pressure, Thermal Airflow, Respiratory Effort, CPAP Flow and Pressure, Oximetry was sampled at 50 Hz. Digital video and audio were recorded.      BASELINE STUDY: Lights Out was at  21:02 and Lights On at 05:00.  Total recording time (TRT) was 479 minutes, with a total sleep time (TST) of 386.5 minutes.   The patient's sleep latency was 54 minutes.  REM latency was 86.5 minutes.  The sleep efficiency was 80.7 %.     SLEEP ARCHITECTURE: WASO (Wake after sleep onset) was 38.5 minutes.  There were 14.5 minutes in Stage N1, 257.5 minutes Stage N2, 24 minutes Stage N3 and 90.5 minutes in Stage REM.  The percentage of Stage N1 was 3.8%, Stage N2 was 66.6%, Stage N3 was 6.2% and Stage R (REM sleep) was 23.4%.   RESPIRATORY ANALYSIS:  There were a total of 8 respiratory events:  0 apneas and 8 hypopneas with 0 respiratory event related arousals (RERAs).The total APNEA/HYPOPNEA INDEX (AHI) was 1.2/hour and the total RESPIRATORY DISTURBANCE INDEX was 1.2 /hour.  5 events occurred in REM sleep and 6 events in NREM. The REM AHI was 3.3 /hour, versus a non-REM AHI of 0.6 /hr. The patient spent 79 minutes of total sleep time in the supine position and 308 minutes in non-supine. The supine AHI was 0.8 versus a non-supine AHI of 1.4.  OXYGEN SATURATION & C02:  The Wake baseline 02 saturation was 88%, with the lowest being 84%. Time spent below 89% saturation equaled 208 minutes. PERIODIC LIMB MOVEMENTS:   The patient had a total of 0 Periodic Limb Movements.  The arousals were noted as: 31 were spontaneous, 0 were associated with PLMs, and 8 were associated with respiratory events. Audio and video analysis did  not show any abnormal or unusual movements, behaviors, phonations or vocalizations.  The patient had nocturia times one. Mild Snoring was noted. EKG was in keeping with normal sinus rhythm (NSR). Post-study, the patient indicated that sleep was the same as usual.   IMPRESSION: NO evidence of sleep disordered breathing, only snoring.  There was prolonged hypoxemia during sleep, which should be evaluated by PCP or pulmonologist.  Spontaneous arousals can be related to pain and discomfort  which the patient described.   RECOMMENDATIONS: We will order ONO and confirm hypoxemia.    1. Consider dedicated pain referral /sleep psychology referral if insomnia is of clinical concern.  2. I recommend to reduce   3. A follow up appointment can be scheduled in the Sleep Clinic at [facility] if the patient desires. The referring provider ( Dr. Brigitte Pulse)  will be notified of the results.     I certify that I have reviewed the entire raw data recording prior to the issuance of this report in accordance with the Standards of Accreditation of the American Academy of Sleep Medicine (AASM)   Larey Seat, MD    07-27-2017 Diplomat, American Board of Psychiatry and Neurology  Diplomat, American Board of [place] Director, [place] [facility] [employer]

## 2017-07-30 ENCOUNTER — Telehealth: Payer: Self-pay | Admitting: Neurology

## 2017-07-30 NOTE — Telephone Encounter (Signed)
-----   Message from Larey Seat, MD sent at 07/27/2017  3:59 PM EST ----- PATIENT'S NAME:  Tonya, Elliott DOB:      09-11-1965      MR#:    [id number]     DATE OF RECORDING: 07/24/2017 REFERRING M.D.:  Assunta Curtis, M.D. Study Performed:   Baseline Polysomnogram  This is a follow up study from HST (apnea link) on 06-06-2017, which revealed an AHI of 3.2/hr. and trivial hypoxemia.  IMPRESSION: NO evidence of sleep disordered breathing, only snoring.  There was evidence of prolonged hypoxemia during sleep, which should be further evaluated by PCP or pulmonologist. The hypoxemia was noted in the first 3 hours of sleep- an unusual distribution and abruptly resolved with change in sleep position- I need to confirm that this is not due to artefact.  Spontaneous arousals can be related to pain and discomfort which the patient described. NO evidence to  the contrary   RECOMMENDATIONS: We will order ONO , will inform PCP of results and  will need pulmonology input if this confirms hypoxemia.   1. Consider dedicated pain referral /sleep psychology referral if insomnia remains of clinical concern.  2. I recommend to reduce   3. A follow up appointment can be scheduled in the Sleep Clinic at [facility] if the patient desires. The referring provider ( Dr. Brigitte Pulse)  will be notified of the results.     I certify that I have reviewed the entire raw data recording prior to the issuance of this report in accordance with the Standards of Accreditation of the American Academy of Sleep Medicine (AASM)   Larey Seat, MD    07-27-2017 Diplomat, American Board of Psychiatry and Neurology  Diplomat, American Board of [place] Director, [place] [facility] [employer]

## 2017-07-30 NOTE — Telephone Encounter (Signed)
Called patient to discuss sleep study results. No answer at this time. LVM for the patient to call back.   

## 2017-07-30 NOTE — Telephone Encounter (Signed)
She had a repeat sleep study- I found no indication if these resuls had been discussed by phone ? Will forward to Gerline Legacy, RN

## 2017-08-01 DIAGNOSIS — M9905 Segmental and somatic dysfunction of pelvic region: Secondary | ICD-10-CM | POA: Diagnosis not present

## 2017-08-01 DIAGNOSIS — M9901 Segmental and somatic dysfunction of cervical region: Secondary | ICD-10-CM | POA: Diagnosis not present

## 2017-08-01 DIAGNOSIS — M9903 Segmental and somatic dysfunction of lumbar region: Secondary | ICD-10-CM | POA: Diagnosis not present

## 2017-08-02 NOTE — Telephone Encounter (Signed)
Pt returned call and I was able to go over the results of the test. I informed her that I would send orders to [employer] a company that will get her set up in doing a over night oximetry test to assess her oxygen. The patient verbalized understanding. Once we get the results and if is shows hypoxemia again at that time we would need to consider her PCP ordering oxygen for her or potentially a referral to pulmonology. We will wait and see what that test shows.Pt had no questions at this time but was encouraged to call back if questions arise.

## 2017-08-07 NOTE — Telephone Encounter (Signed)
Received notification from [employer] They attempted to call the patient 4 times to have her set up for ONO. I replied back giving them her work number.   "I called that number and finally got through to her. However she advised that she would not like the OnO anymore. I have put a note in her chart. "

## 2017-08-16 DIAGNOSIS — M9901 Segmental and somatic dysfunction of cervical region: Secondary | ICD-10-CM | POA: Diagnosis not present

## 2017-08-16 DIAGNOSIS — M9903 Segmental and somatic dysfunction of lumbar region: Secondary | ICD-10-CM | POA: Diagnosis not present

## 2017-08-16 DIAGNOSIS — M9905 Segmental and somatic dysfunction of pelvic region: Secondary | ICD-10-CM | POA: Diagnosis not present

## 2017-09-06 DIAGNOSIS — M9903 Segmental and somatic dysfunction of lumbar region: Secondary | ICD-10-CM | POA: Diagnosis not present

## 2017-09-06 DIAGNOSIS — M9905 Segmental and somatic dysfunction of pelvic region: Secondary | ICD-10-CM | POA: Diagnosis not present

## 2017-09-06 DIAGNOSIS — M9901 Segmental and somatic dysfunction of cervical region: Secondary | ICD-10-CM | POA: Diagnosis not present

## 2017-09-20 DIAGNOSIS — M9903 Segmental and somatic dysfunction of lumbar region: Secondary | ICD-10-CM | POA: Diagnosis not present

## 2017-09-20 DIAGNOSIS — M9905 Segmental and somatic dysfunction of pelvic region: Secondary | ICD-10-CM | POA: Diagnosis not present

## 2017-09-20 DIAGNOSIS — M9901 Segmental and somatic dysfunction of cervical region: Secondary | ICD-10-CM | POA: Diagnosis not present

## 2017-10-11 DIAGNOSIS — M9903 Segmental and somatic dysfunction of lumbar region: Secondary | ICD-10-CM | POA: Diagnosis not present

## 2017-10-11 DIAGNOSIS — M9905 Segmental and somatic dysfunction of pelvic region: Secondary | ICD-10-CM | POA: Diagnosis not present

## 2017-10-11 DIAGNOSIS — M9901 Segmental and somatic dysfunction of cervical region: Secondary | ICD-10-CM | POA: Diagnosis not present

## 2017-11-08 DIAGNOSIS — M9905 Segmental and somatic dysfunction of pelvic region: Secondary | ICD-10-CM | POA: Diagnosis not present

## 2017-11-08 DIAGNOSIS — M9903 Segmental and somatic dysfunction of lumbar region: Secondary | ICD-10-CM | POA: Diagnosis not present

## 2017-11-08 DIAGNOSIS — M9901 Segmental and somatic dysfunction of cervical region: Secondary | ICD-10-CM | POA: Diagnosis not present

## 2017-12-13 DIAGNOSIS — M9901 Segmental and somatic dysfunction of cervical region: Secondary | ICD-10-CM | POA: Diagnosis not present

## 2017-12-13 DIAGNOSIS — M9903 Segmental and somatic dysfunction of lumbar region: Secondary | ICD-10-CM | POA: Diagnosis not present

## 2017-12-13 DIAGNOSIS — M9905 Segmental and somatic dysfunction of pelvic region: Secondary | ICD-10-CM | POA: Diagnosis not present

## 2018-01-29 DIAGNOSIS — M150 Primary generalized (osteo)arthritis: Secondary | ICD-10-CM | POA: Diagnosis not present

## 2018-01-29 DIAGNOSIS — M797 Fibromyalgia: Secondary | ICD-10-CM | POA: Diagnosis not present

## 2018-01-29 DIAGNOSIS — M4690 Unspecified inflammatory spondylopathy, site unspecified: Secondary | ICD-10-CM | POA: Diagnosis not present

## 2018-03-28 DIAGNOSIS — R82998 Other abnormal findings in urine: Secondary | ICD-10-CM | POA: Diagnosis not present

## 2018-03-28 DIAGNOSIS — Z0000 Encounter for general adult medical examination without abnormal findings: Secondary | ICD-10-CM | POA: Diagnosis not present

## 2018-04-02 ENCOUNTER — Encounter: Payer: Self-pay | Admitting: Internal Medicine

## 2018-04-03 ENCOUNTER — Encounter: Payer: Self-pay | Admitting: Internal Medicine

## 2018-04-04 DIAGNOSIS — R740 Nonspecific elevation of levels of transaminase and lactic acid dehydrogenase [LDH]: Secondary | ICD-10-CM | POA: Diagnosis not present

## 2018-04-04 DIAGNOSIS — M797 Fibromyalgia: Secondary | ICD-10-CM | POA: Diagnosis not present

## 2018-04-04 DIAGNOSIS — Z23 Encounter for immunization: Secondary | ICD-10-CM | POA: Diagnosis not present

## 2018-04-04 DIAGNOSIS — E781 Pure hyperglyceridemia: Secondary | ICD-10-CM | POA: Diagnosis not present

## 2018-04-04 DIAGNOSIS — Z0000 Encounter for general adult medical examination without abnormal findings: Secondary | ICD-10-CM | POA: Diagnosis not present

## 2018-04-19 DIAGNOSIS — R0982 Postnasal drip: Secondary | ICD-10-CM | POA: Diagnosis not present

## 2018-04-24 ENCOUNTER — Ambulatory Visit (AMBULATORY_SURGERY_CENTER): Payer: Self-pay | Admitting: *Deleted

## 2018-04-24 VITALS — Ht 68.0 in | Wt 198.0 lb

## 2018-04-24 DIAGNOSIS — Z86010 Personal history of colonic polyps: Secondary | ICD-10-CM

## 2018-04-24 MED ORDER — NA SULFATE-K SULFATE-MG SULF 17.5-3.13-1.6 GM/177ML PO SOLN: 1.0000 | Freq: Once | ORAL | 0 refills | Status: AC

## 2018-04-24 NOTE — Progress Notes (Signed)
No egg or soy allergy known to patient  No issues with past sedation with any surgeries  or procedures, no intubation problems  No diet pills per patient No home 02 use per patient  No blood thinners per patient  Pt denies issues with constipation  No A fib or A flutter -  EMMI video sent to pt's e mail - pt declined  Suprep $15 coupon to pt

## 2018-05-07 ENCOUNTER — Encounter: Payer: Self-pay | Admitting: Internal Medicine

## 2018-05-16 ENCOUNTER — Encounter: Payer: 59 | Admitting: Internal Medicine

## 2018-05-21 ENCOUNTER — Encounter: Payer: Self-pay | Admitting: Internal Medicine

## 2018-05-21 ENCOUNTER — Ambulatory Visit (AMBULATORY_SURGERY_CENTER): Payer: 59 | Admitting: Internal Medicine

## 2018-05-21 VITALS — BP 128/85 | HR 63 | Temp 98.4°F | Resp 22 | Ht 68.0 in | Wt 198.0 lb

## 2018-05-21 DIAGNOSIS — Z86010 Personal history of colonic polyps: Secondary | ICD-10-CM | POA: Diagnosis not present

## 2018-05-21 MED ORDER — SODIUM CHLORIDE 0.9 % IV SOLN: 500.0000 mL | Freq: Once | INTRAVENOUS | Status: DC

## 2018-05-21 NOTE — Progress Notes (Signed)
Spontaneous respirations throughout. VSS. Resting comfortably. To PACU on room air. Report to  RN. 

## 2018-05-21 NOTE — Op Note (Signed)
[facility] Patient Name: Tonya Elliott Procedure Date: 05/21/2018 9:26 AM MRN: [id number] Endoscopist: Docia Chuck. Henrene Pastor , MD Age: 52 Referring MD:  Date of Birth: 03-29-1966 Gender: Female Account #: [id number] Procedure:                Colonoscopy Indications:              High risk colon cancer surveillance: Personal                            history of adenoma (10 mm or greater in size), High                            risk colon cancer surveillance: Personal history of                            multiple (3 or more) adenomas. Index exam 2017 with                            large sessile adenoma removed piecemeal and marked.                            Follow-up exam 2018 with some residual polyp Medicines:                Monitored Anesthesia Care Procedure:                Pre-Anesthesia Assessment:                           - Prior to the procedure, a History and Physical                            was performed, and patient medications and                            allergies were reviewed. The patient's tolerance of                            previous anesthesia was also reviewed. The risks                            and benefits of the procedure and the sedation                            options and risks were discussed with the patient.                            All questions were answered, and informed consent                            was obtained. Prior Anticoagulants: The patient has                            taken no previous anticoagulant or antiplatelet  agents. ASA Grade Assessment: II - A patient with                            mild systemic disease. After reviewing the risks                            and benefits, the patient was deemed in                            satisfactory condition to undergo the procedure.                           After obtaining informed consent, the colonoscope                            was passed  under direct vision. Throughout the                            procedure, the patient's blood pressure, pulse, and                            oxygen saturations were monitored continuously. The                            Colonoscope was introduced through the anus and                            advanced to the the cecum, identified by                            appendiceal orifice and ileocecal valve. The                            ileocecal valve, appendiceal orifice, and rectum                            were photographed. The quality of the bowel                            preparation was excellent. The colonoscopy was                            performed without difficulty. The patient tolerated                            the procedure well. The bowel preparation used was                            SUPREP. Scope In: 9:38:57 AM Scope Out: 9:51:36 AM Scope Withdrawal Time: 0 hours 9 minutes 18 seconds  Total Procedure Duration: 0 hours 12 minutes 39 seconds  Findings:                 The entire examined colon appeared normal on direct  and retroflexion views. The previous polypectomy                            site was easily identified. No residual adenoma                            present. Complications:            No immediate complications. Estimated blood loss:                            None. Estimated Blood Loss:     Estimated blood loss: none. Impression:               - The entire examined colon is normal on direct and                            retroflexion views. Prior polypectomy site clean.                           - No specimens collected. Recommendation:           - Repeat colonoscopy in 3 years for surveillance.                           - Patient has a contact number available for                            emergencies. The signs and symptoms of potential                            delayed complications were discussed with the                             patient. Return to normal activities tomorrow.                            Written discharge instructions were provided to the                            patient.                           - Resume previous diet.                           - Continue present medications. Docia Chuck. Henrene Pastor, MD 05/21/2018 10:01:35 AM This report has been signed electronically.

## 2018-05-21 NOTE — Patient Instructions (Signed)
  Thank you for allowing [place] to care for you today!  Resume previous diet and medications today!  Return to normal activities tomorrow.  Recommend surveillance colonoscopy in 3 years.    YOU HAD AN ENDOSCOPIC PROCEDURE TODAY AT [facility]:   Refer to the procedure report that was given to you for any specific questions about what was found during the examination.  If the procedure report does not answer your questions, please call your gastroenterologist to clarify.  If you requested that your care partner not be given the details of your procedure findings, then the procedure report has been included in a sealed envelope for you to review at your convenience later.  YOU SHOULD EXPECT: Some feelings of bloating in the abdomen. Passage of more gas than usual.  Walking can help get rid of the air that was put into your GI tract during the procedure and reduce the bloating. If you had a lower endoscopy (such as a colonoscopy or flexible sigmoidoscopy) you may notice spotting of blood in your stool or on the toilet paper. If you underwent a bowel prep for your procedure, you may not have a normal bowel movement for a few days.  Please Note:  You might notice some irritation and congestion in your nose or some drainage.  This is from the oxygen used during your procedure.  There is no need for concern and it should clear up in a day or so.  SYMPTOMS TO REPORT IMMEDIATELY:   Following lower endoscopy (colonoscopy or flexible sigmoidoscopy):  Excessive amounts of blood in the stool  Significant tenderness or worsening of abdominal pains  Swelling of the abdomen that is new, acute  Fever of 100F or higher   For urgent or emergent issues, a gastroenterologist can be reached at any hour by calling [phone].   DIET:  We do recommend a small meal at first, but then you may proceed to your regular diet.  Drink plenty of fluids but you should avoid alcoholic beverages for 24  hours.  ACTIVITY:  You should plan to take it easy for the rest of today and you should NOT DRIVE or use heavy machinery until tomorrow (because of the sedation medicines used during the test).    FOLLOW UP: Our staff will call the number listed on your records the next business day following your procedure to check on you and address any questions or concerns that you may have regarding the information given to you following your procedure. If we do not reach you, we will leave a message.  However, if you are feeling well and you are not experiencing any problems, there is no need to return our call.  We will assume that you have returned to your regular daily activities without incident.  If any biopsies were taken you will be contacted by phone or by letter within the next 1-3 weeks.  Please call us at [phone] if you have not heard about the biopsies in 3 weeks.    SIGNATURES/CONFIDENTIALITY: You and/or your care partner have signed paperwork which will be entered into your electronic medical record.  These signatures attest to the fact that that the information above on your After Visit Summary has been reviewed and is understood.  Full responsibility of the confidentiality of this discharge information lies with you and/or your care-partner.

## 2018-05-22 ENCOUNTER — Telehealth: Payer: Self-pay

## 2018-05-22 NOTE — Telephone Encounter (Signed)
  Follow up Call-  Call back number 05/21/2018 03/22/2017 04/07/2016  Post procedure Call Back phone  # [phone]  Permission to leave phone message Yes Yes Yes  Some recent data might be hidden     Patient questions:  Do you have a fever, pain , or abdominal swelling? No. Pain Score  0 *  Have you tolerated food without any problems? Yes.    Have you been able to return to your normal activities? Yes.    Do you have any questions about your discharge instructions: Diet   No. Medications  No. Follow up visit  No.  Do you have questions or concerns about your Care? No.  Actions: * If pain score is 4 or above: No action needed, pain <4.  No problems noted per pt. maw

## 2018-07-03 DIAGNOSIS — Z1231 Encounter for screening mammogram for malignant neoplasm of breast: Secondary | ICD-10-CM | POA: Diagnosis not present

## 2018-08-01 DIAGNOSIS — M797 Fibromyalgia: Secondary | ICD-10-CM | POA: Diagnosis not present

## 2018-08-01 DIAGNOSIS — Z1589 Genetic susceptibility to other disease: Secondary | ICD-10-CM | POA: Diagnosis not present

## 2018-08-01 DIAGNOSIS — M4690 Unspecified inflammatory spondylopathy, site unspecified: Secondary | ICD-10-CM | POA: Diagnosis not present

## 2018-08-01 DIAGNOSIS — M150 Primary generalized (osteo)arthritis: Secondary | ICD-10-CM | POA: Diagnosis not present

## 2019-01-29 DIAGNOSIS — Z1589 Genetic susceptibility to other disease: Secondary | ICD-10-CM | POA: Diagnosis not present

## 2019-01-29 DIAGNOSIS — M150 Primary generalized (osteo)arthritis: Secondary | ICD-10-CM | POA: Diagnosis not present

## 2019-01-29 DIAGNOSIS — M797 Fibromyalgia: Secondary | ICD-10-CM | POA: Diagnosis not present

## 2019-01-29 DIAGNOSIS — M4690 Unspecified inflammatory spondylopathy, site unspecified: Secondary | ICD-10-CM | POA: Diagnosis not present

## 2019-04-03 DIAGNOSIS — Z0000 Encounter for general adult medical examination without abnormal findings: Secondary | ICD-10-CM | POA: Diagnosis not present

## 2019-04-03 DIAGNOSIS — Z23 Encounter for immunization: Secondary | ICD-10-CM | POA: Diagnosis not present

## 2019-04-03 DIAGNOSIS — E781 Pure hyperglyceridemia: Secondary | ICD-10-CM | POA: Diagnosis not present

## 2019-04-10 DIAGNOSIS — E781 Pure hyperglyceridemia: Secondary | ICD-10-CM | POA: Diagnosis not present

## 2019-04-10 DIAGNOSIS — M797 Fibromyalgia: Secondary | ICD-10-CM | POA: Diagnosis not present

## 2019-04-10 DIAGNOSIS — R7401 Elevation of levels of liver transaminase levels: Secondary | ICD-10-CM | POA: Diagnosis not present

## 2019-04-10 DIAGNOSIS — M459 Ankylosing spondylitis of unspecified sites in spine: Secondary | ICD-10-CM | POA: Diagnosis not present

## 2019-04-10 DIAGNOSIS — Z1331 Encounter for screening for depression: Secondary | ICD-10-CM | POA: Diagnosis not present

## 2019-04-10 DIAGNOSIS — Z0000 Encounter for general adult medical examination without abnormal findings: Secondary | ICD-10-CM | POA: Diagnosis not present

## 2019-04-11 DIAGNOSIS — Z124 Encounter for screening for malignant neoplasm of cervix: Secondary | ICD-10-CM | POA: Diagnosis not present

## 2019-04-11 DIAGNOSIS — Z6831 Body mass index (BMI) 31.0-31.9, adult: Secondary | ICD-10-CM | POA: Diagnosis not present

## 2019-04-11 DIAGNOSIS — Z01419 Encounter for gynecological examination (general) (routine) without abnormal findings: Secondary | ICD-10-CM | POA: Diagnosis not present

## 2019-07-09 DIAGNOSIS — Z1231 Encounter for screening mammogram for malignant neoplasm of breast: Secondary | ICD-10-CM | POA: Diagnosis not present

## 2019-07-30 DIAGNOSIS — M4690 Unspecified inflammatory spondylopathy, site unspecified: Secondary | ICD-10-CM | POA: Diagnosis not present

## 2019-07-30 DIAGNOSIS — M797 Fibromyalgia: Secondary | ICD-10-CM | POA: Diagnosis not present

## 2019-07-30 DIAGNOSIS — M150 Primary generalized (osteo)arthritis: Secondary | ICD-10-CM | POA: Diagnosis not present

## 2019-07-30 DIAGNOSIS — R5383 Other fatigue: Secondary | ICD-10-CM | POA: Diagnosis not present

## 2019-08-29 ENCOUNTER — Ambulatory Visit: Payer: Self-pay | Attending: Internal Medicine

## 2019-08-29 DIAGNOSIS — Z23 Encounter for immunization: Secondary | ICD-10-CM

## 2019-08-29 NOTE — Progress Notes (Signed)
   Covid-19 Vaccination Clinic  Name:  Tonya Elliott    MRN: [id number] DOB: 12/21/1965  08/29/2019  Ms. Tonya Elliott was observed post Covid-19 immunization for 15 minutes without incident. She was provided with Vaccine Information Sheet and instruction to access the V-Safe system.   Ms. Tonya Elliott was instructed to call 911 with any severe reactions post vaccine: Marland Kitchen Difficulty breathing  . Swelling of face and throat  . A fast heartbeat  . A bad rash all over body  . Dizziness and weakness   Immunizations Administered    Name Date Dose VIS Date Route   Pfizer COVID-19 Vaccine 08/29/2019 12:20 PM 0.3 mL 05/16/2019 Intramuscular   Manufacturer: [place]   Lot: [id number]   [place]: [id number]

## 2019-09-24 ENCOUNTER — Ambulatory Visit: Payer: Self-pay | Attending: Internal Medicine

## 2019-09-24 DIAGNOSIS — Z23 Encounter for immunization: Secondary | ICD-10-CM

## 2019-09-24 NOTE — Progress Notes (Signed)
   Covid-19 Vaccination Clinic  Name:  Tonya Elliott    MRN: [id number] DOB: 1965/10/04  09/24/2019  [name] was observed post Covid-19 immunization for 15 minutes without incident. She was provided with Vaccine Information Sheet and instruction to access the V-Safe system.   [name] was instructed to call 911 with any severe reactions post vaccine: Marland Kitchen Difficulty breathing  . Swelling of face and throat  . A fast heartbeat  . A bad rash all over body  . Dizziness and weakness   Immunizations Administered    Name Date Dose VIS Date Route   Pfizer COVID-19 Vaccine 09/24/2019 11:03 AM 0.3 mL 07/30/2018 Intramuscular   Manufacturer: [place]   Lot: [id number]   [place]: [id number]

## 2020-01-27 DIAGNOSIS — M4690 Unspecified inflammatory spondylopathy, site unspecified: Secondary | ICD-10-CM | POA: Diagnosis not present

## 2020-01-27 DIAGNOSIS — M797 Fibromyalgia: Secondary | ICD-10-CM | POA: Diagnosis not present

## 2020-01-27 DIAGNOSIS — M150 Primary generalized (osteo)arthritis: Secondary | ICD-10-CM | POA: Diagnosis not present

## 2020-01-27 DIAGNOSIS — Z1589 Genetic susceptibility to other disease: Secondary | ICD-10-CM | POA: Diagnosis not present

## 2020-04-07 DIAGNOSIS — Z0000 Encounter for general adult medical examination without abnormal findings: Secondary | ICD-10-CM | POA: Diagnosis not present

## 2020-04-07 DIAGNOSIS — E781 Pure hyperglyceridemia: Secondary | ICD-10-CM | POA: Diagnosis not present

## 2020-04-14 DIAGNOSIS — Z1389 Encounter for screening for other disorder: Secondary | ICD-10-CM | POA: Diagnosis not present

## 2020-04-14 DIAGNOSIS — R7401 Elevation of levels of liver transaminase levels: Secondary | ICD-10-CM | POA: Diagnosis not present

## 2020-04-14 DIAGNOSIS — Z23 Encounter for immunization: Secondary | ICD-10-CM | POA: Diagnosis not present

## 2020-04-14 DIAGNOSIS — I10 Essential (primary) hypertension: Secondary | ICD-10-CM | POA: Diagnosis not present

## 2020-04-14 DIAGNOSIS — Z1331 Encounter for screening for depression: Secondary | ICD-10-CM | POA: Diagnosis not present

## 2020-04-14 DIAGNOSIS — R82998 Other abnormal findings in urine: Secondary | ICD-10-CM | POA: Diagnosis not present

## 2020-04-14 DIAGNOSIS — Z0000 Encounter for general adult medical examination without abnormal findings: Secondary | ICD-10-CM | POA: Diagnosis not present

## 2020-05-03 DIAGNOSIS — Z1212 Encounter for screening for malignant neoplasm of rectum: Secondary | ICD-10-CM | POA: Diagnosis not present

## 2020-05-06 DIAGNOSIS — Z01419 Encounter for gynecological examination (general) (routine) without abnormal findings: Secondary | ICD-10-CM | POA: Diagnosis not present

## 2020-05-17 DIAGNOSIS — I10 Essential (primary) hypertension: Secondary | ICD-10-CM | POA: Diagnosis not present

## 2020-06-14 DIAGNOSIS — I10 Essential (primary) hypertension: Secondary | ICD-10-CM | POA: Diagnosis not present

## 2020-08-19 DIAGNOSIS — Z1589 Genetic susceptibility to other disease: Secondary | ICD-10-CM | POA: Diagnosis not present

## 2020-08-19 DIAGNOSIS — M150 Primary generalized (osteo)arthritis: Secondary | ICD-10-CM | POA: Diagnosis not present

## 2020-08-19 DIAGNOSIS — M797 Fibromyalgia: Secondary | ICD-10-CM | POA: Diagnosis not present

## 2020-08-19 DIAGNOSIS — M4690 Unspecified inflammatory spondylopathy, site unspecified: Secondary | ICD-10-CM | POA: Diagnosis not present

## 2020-08-23 DIAGNOSIS — Z1231 Encounter for screening mammogram for malignant neoplasm of breast: Secondary | ICD-10-CM | POA: Diagnosis not present

## 2020-09-08 DIAGNOSIS — R922 Inconclusive mammogram: Secondary | ICD-10-CM | POA: Diagnosis not present

## 2020-09-08 DIAGNOSIS — R928 Other abnormal and inconclusive findings on diagnostic imaging of breast: Secondary | ICD-10-CM | POA: Diagnosis not present

## 2021-02-22 DIAGNOSIS — M797 Fibromyalgia: Secondary | ICD-10-CM | POA: Diagnosis not present

## 2021-02-22 DIAGNOSIS — M4690 Unspecified inflammatory spondylopathy, site unspecified: Secondary | ICD-10-CM | POA: Diagnosis not present

## 2021-02-22 DIAGNOSIS — M150 Primary generalized (osteo)arthritis: Secondary | ICD-10-CM | POA: Diagnosis not present

## 2021-02-22 DIAGNOSIS — R5383 Other fatigue: Secondary | ICD-10-CM | POA: Diagnosis not present

## 2021-03-01 ENCOUNTER — Encounter: Payer: Self-pay | Admitting: Gastroenterology

## 2021-04-18 ENCOUNTER — Encounter: Payer: Self-pay | Admitting: Internal Medicine

## 2021-05-02 DIAGNOSIS — I10 Essential (primary) hypertension: Secondary | ICD-10-CM | POA: Diagnosis not present

## 2021-05-09 DIAGNOSIS — Z1331 Encounter for screening for depression: Secondary | ICD-10-CM | POA: Diagnosis not present

## 2021-05-09 DIAGNOSIS — Z0000 Encounter for general adult medical examination without abnormal findings: Secondary | ICD-10-CM | POA: Diagnosis not present

## 2021-05-09 DIAGNOSIS — R82998 Other abnormal findings in urine: Secondary | ICD-10-CM | POA: Diagnosis not present

## 2021-05-09 DIAGNOSIS — Z1339 Encounter for screening examination for other mental health and behavioral disorders: Secondary | ICD-10-CM | POA: Diagnosis not present

## 2021-05-09 DIAGNOSIS — R7401 Elevation of levels of liver transaminase levels: Secondary | ICD-10-CM | POA: Diagnosis not present

## 2021-05-09 DIAGNOSIS — I10 Essential (primary) hypertension: Secondary | ICD-10-CM | POA: Diagnosis not present

## 2021-05-09 DIAGNOSIS — R7301 Impaired fasting glucose: Secondary | ICD-10-CM | POA: Diagnosis not present

## 2021-05-09 DIAGNOSIS — Z23 Encounter for immunization: Secondary | ICD-10-CM | POA: Diagnosis not present

## 2021-05-11 ENCOUNTER — Other Ambulatory Visit: Payer: Self-pay | Admitting: Internal Medicine

## 2021-05-11 DIAGNOSIS — R7401 Elevation of levels of liver transaminase levels: Secondary | ICD-10-CM

## 2021-05-24 ENCOUNTER — Ambulatory Visit
Admission: RE | Admit: 2021-05-24 | Discharge: 2021-05-24 | Disposition: A | Discharge status: Home / Self Care | Payer: BC Managed Care – PPO | Source: Ambulatory Visit | Attending: Internal Medicine | Admitting: Internal Medicine

## 2021-05-24 DIAGNOSIS — R7401 Elevation of levels of liver transaminase levels: Secondary | ICD-10-CM

## 2021-05-24 DIAGNOSIS — K760 Fatty (change of) liver, not elsewhere classified: Secondary | ICD-10-CM | POA: Diagnosis not present

## 2021-05-24 IMAGING — US US ABDOMEN LIMITED
1 series · 14 of 25 positions shown · non-contrast
Comparison: None.

CLINICAL DATA: Elevated liver enzymes.

EXAM:
ULTRASOUND ABDOMEN LIMITED RIGHT UPPER QUADRANT

[Series 1: us abdomen limited · 0.26mm/px · 14 of 59 slices shown]
[im 1/59]
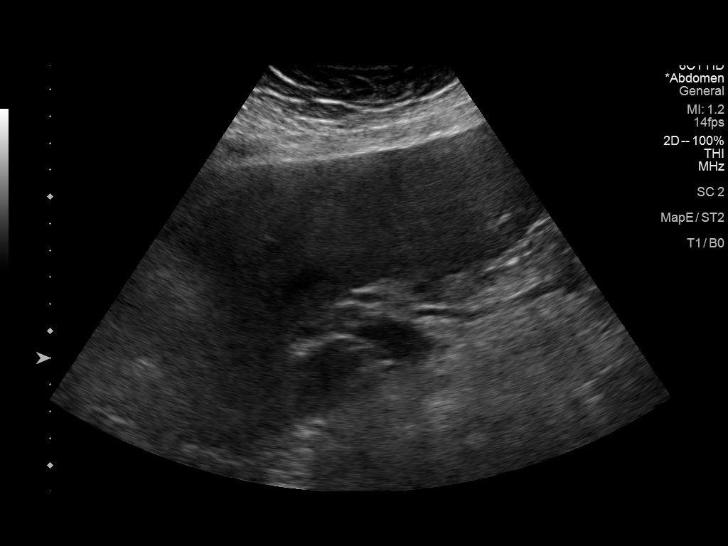
[im 5/59]
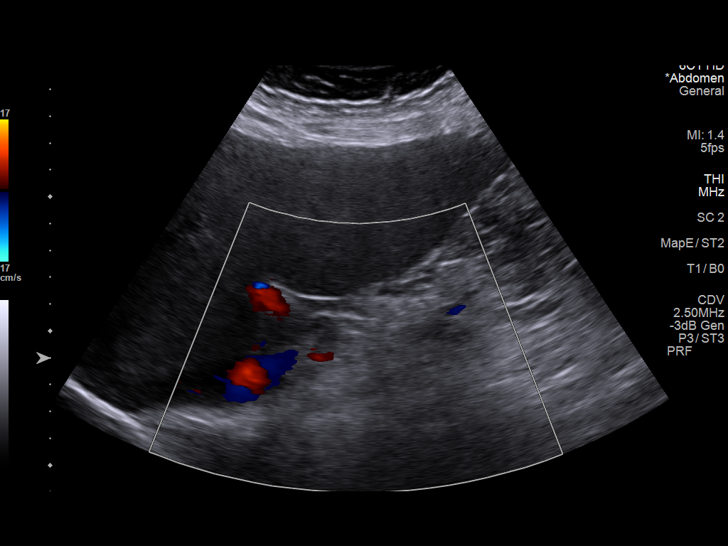
[im 10/59]
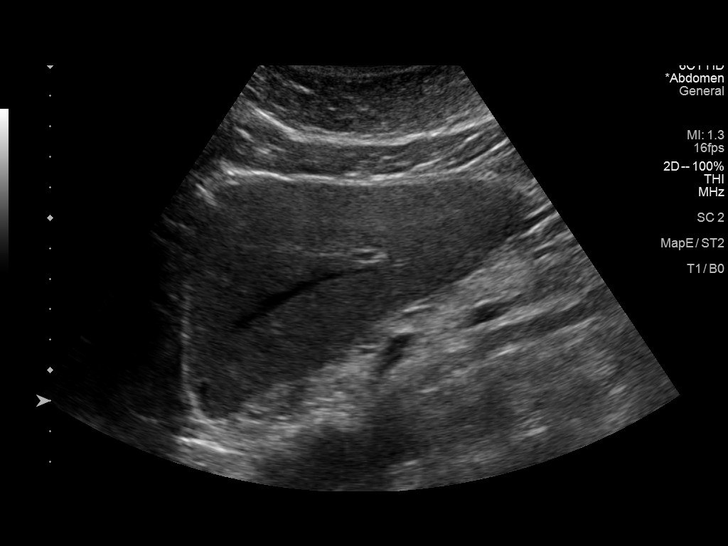
[im 15/59]
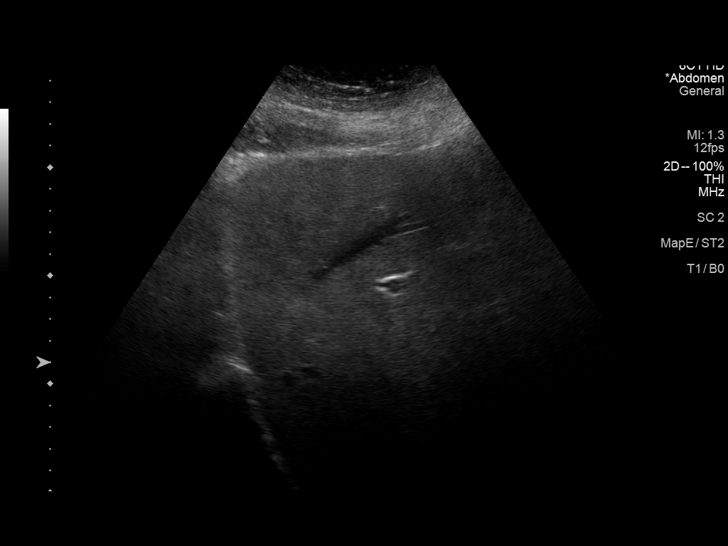
[im 20/59]
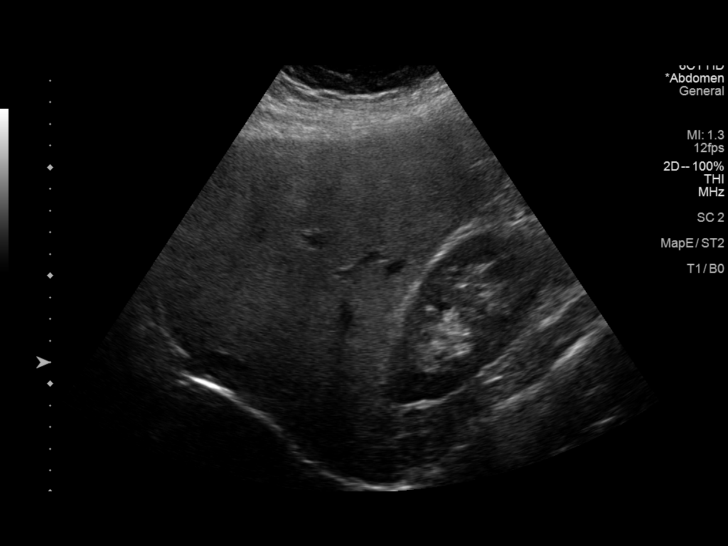
[im 22/59]
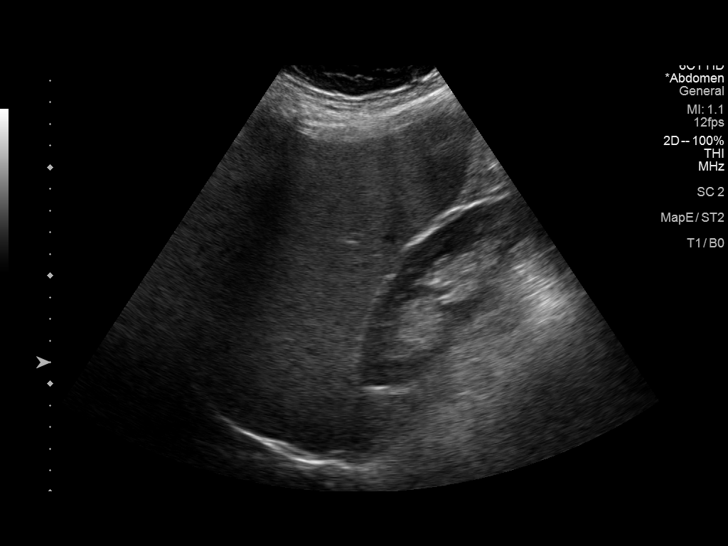
[im 27/59]
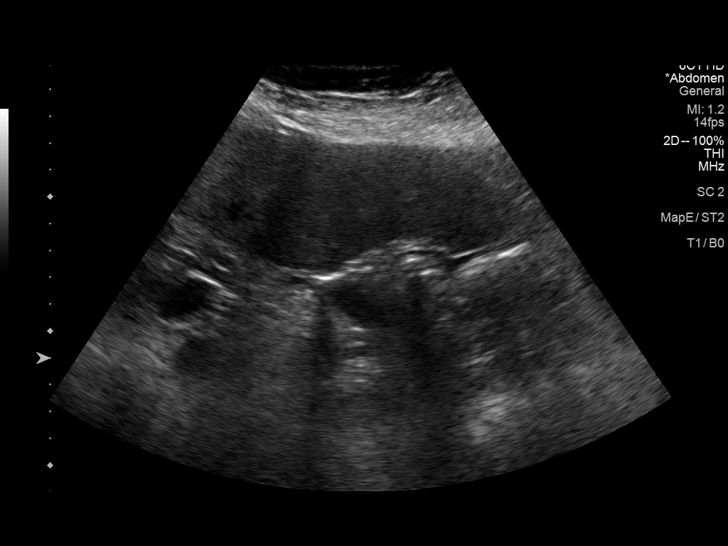
[im 32/59]
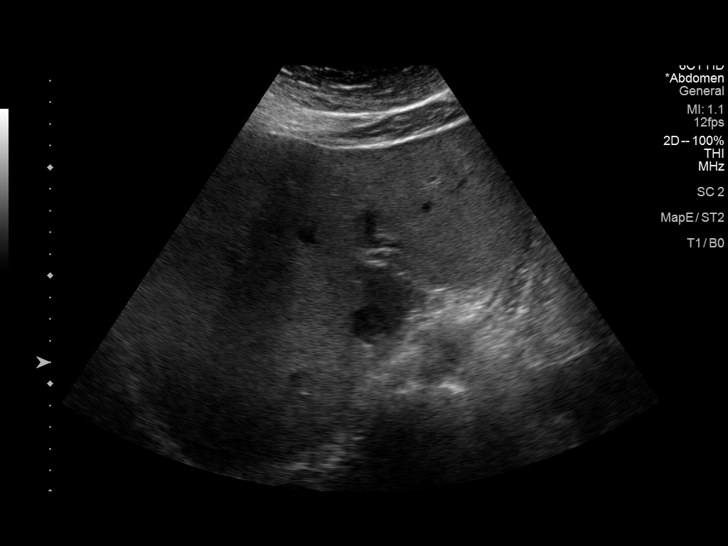
[im 37/59]
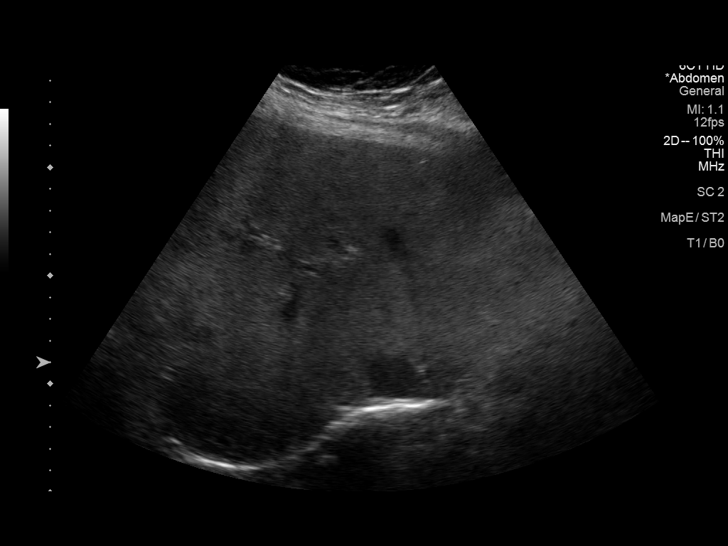
[im 39/59]
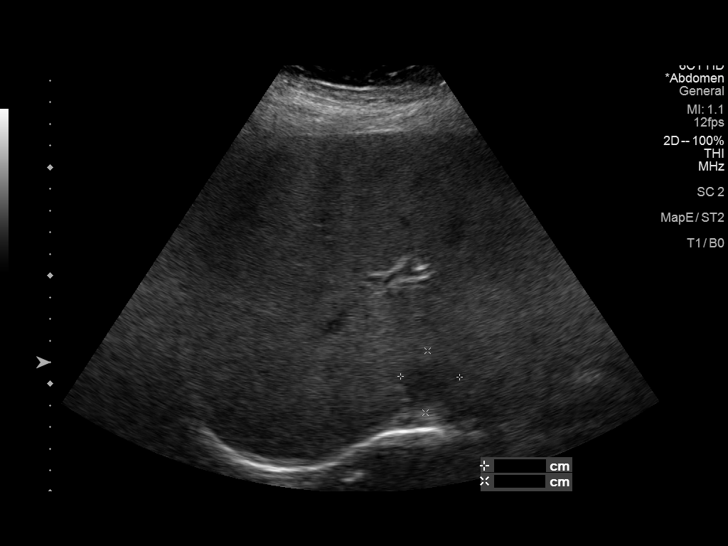
[im 44/59]
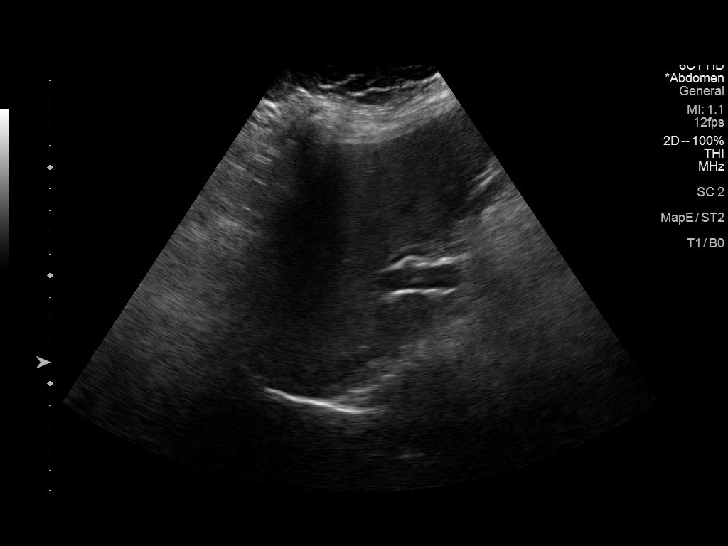
[im 49/59]
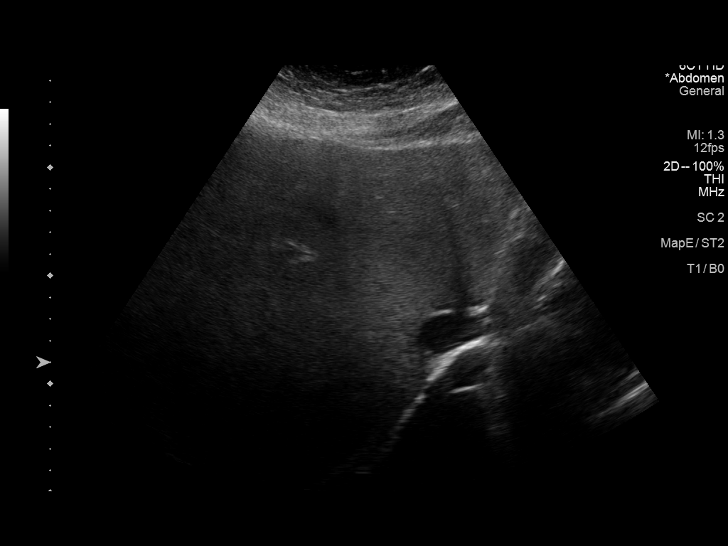
[im 54/59]
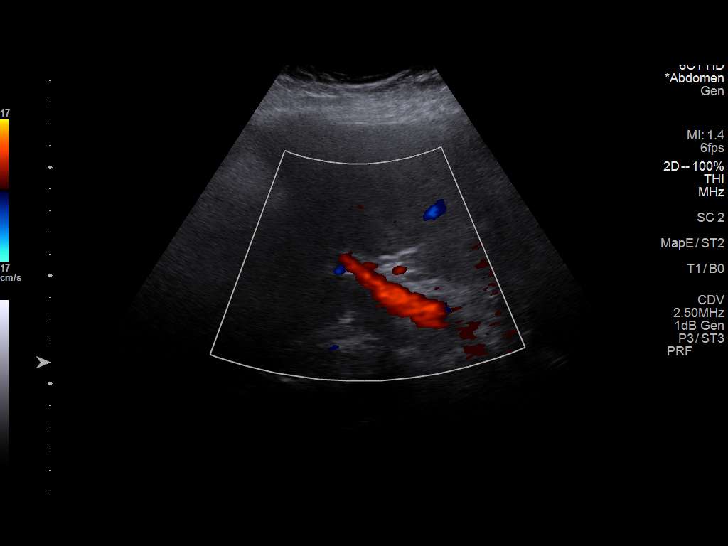
[im 59/59]
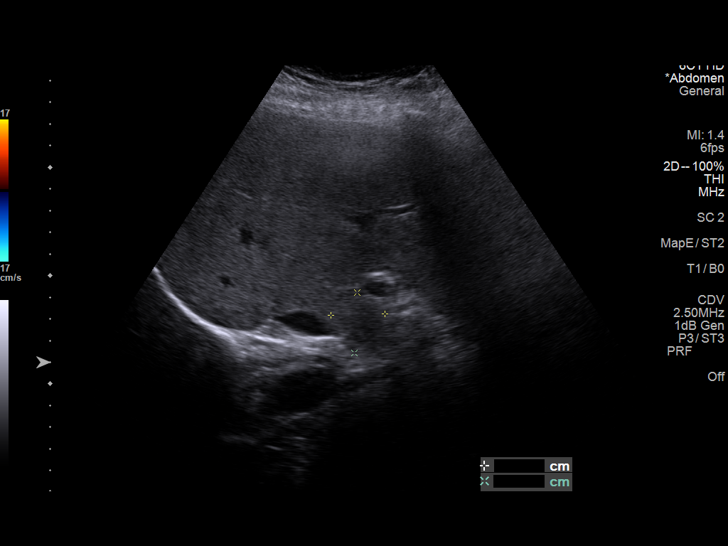

[14 of 25 positions shown; findings below may reference images not displayed]

FINDINGS: Gallbladder:

Cholecystectomy.

Common bile duct:

Diameter: 5 mm

Liver:

There is diffuse increased liver echogenicity most commonly seen in
the setting of fatty infiltration. Superimposed inflammation or
fibrosis is not excluded. Clinical correlation is recommended.
Portal vein is patent on color Doppler imaging with normal direction
of blood flow towards the liver.

Other: None.
IMPRESSION: 1. Fatty liver.
2. Cholecystectomy.  A

## 2021-08-02 ENCOUNTER — Encounter: Payer: Self-pay | Admitting: Internal Medicine

## 2021-08-09 ENCOUNTER — Other Ambulatory Visit: Payer: Self-pay

## 2021-08-09 ENCOUNTER — Ambulatory Visit (INDEPENDENT_AMBULATORY_CARE_PROVIDER_SITE_OTHER): Payer: BC Managed Care – PPO

## 2021-08-09 ENCOUNTER — Encounter: Payer: Self-pay | Admitting: Family Medicine

## 2021-08-09 ENCOUNTER — Ambulatory Visit (INDEPENDENT_AMBULATORY_CARE_PROVIDER_SITE_OTHER): Payer: BC Managed Care – PPO | Admitting: Family Medicine

## 2021-08-09 VITALS — BP 138/86 | HR 82 | Ht 68.0 in | Wt 208.0 lb

## 2021-08-09 DIAGNOSIS — M2550 Pain in unspecified joint: Secondary | ICD-10-CM

## 2021-08-09 DIAGNOSIS — M25552 Pain in left hip: Secondary | ICD-10-CM | POA: Diagnosis not present

## 2021-08-09 DIAGNOSIS — M545 Low back pain, unspecified: Secondary | ICD-10-CM | POA: Diagnosis not present

## 2021-08-09 DIAGNOSIS — R102 Pelvic and perineal pain: Secondary | ICD-10-CM | POA: Diagnosis not present

## 2021-08-09 DIAGNOSIS — M450 Ankylosing spondylitis of multiple sites in spine: Secondary | ICD-10-CM

## 2021-08-09 LAB — COMPREHENSIVE METABOLIC PANEL
ALT: 46 U/L — ABNORMAL HIGH (ref 0–35)
AST: 33 U/L (ref 0–37)
Albumin: 4.5 g/dL (ref 3.5–5.2)
Alkaline Phosphatase: 89 U/L (ref 39–117)
BUN: 17 mg/dL (ref 6–23)
CO2: 29 mEq/L (ref 19–32)
Calcium: 9.5 mg/dL (ref 8.4–10.5)
Chloride: 104 mEq/L (ref 96–112)
Creatinine, Ser: 1.03 mg/dL (ref 0.40–1.20)
GFR: 60.6 mL/min (ref >60.00)
Glucose, Bld: 100 mg/dL — ABNORMAL HIGH (ref 70–99)
Potassium: 3.7 mEq/L (ref 3.5–5.1)
Sodium: 141 mEq/L (ref 135–145)
Total Bilirubin: 0.3 mg/dL (ref 0.2–1.2)
Total Protein: 6.9 g/dL (ref 6.0–8.3)

## 2021-08-09 LAB — VITAMIN D 25 HYDROXY (VIT D DEFICIENCY, FRACTURES): VITD: 31.01 ng/mL (ref 30.00–100.00)

## 2021-08-09 LAB — CBC WITH DIFFERENTIAL/PLATELET
Basophils Absolute: 0 10*3/uL (ref 0.0–0.1)
Basophils Relative: 0.5 % (ref 0.0–3.0)
Eosinophils Absolute: 0.4 10*3/uL (ref 0.0–0.7)
Eosinophils Relative: 3.6 % (ref 0.0–5.0)
HCT: 36.8 % (ref 36.0–46.0)
Hemoglobin: 12.5 g/dL (ref 12.0–15.0)
Lymphocytes Relative: 41.8 % (ref 12.0–46.0)
Lymphs Abs: 4.1 10*3/uL — ABNORMAL HIGH (ref 0.7–4.0)
MCHC: 34 g/dL (ref 30.0–36.0)
MCV: 86.1 fl (ref 78.0–100.0)
Monocytes Absolute: 0.8 10*3/uL (ref 0.1–1.0)
Monocytes Relative: 8.6 % (ref 3.0–12.0)
Neutro Abs: 4.5 10*3/uL (ref 1.4–7.7)
Neutrophils Relative %: 45.5 % (ref 43.0–77.0)
Platelets: 245 10*3/uL (ref 150.0–400.0)
RBC: 4.28 Mil/uL (ref 3.87–5.11)
RDW: 13.3 % (ref 11.5–15.5)
WBC: 9.9 10*3/uL (ref 4.0–10.5)

## 2021-08-09 LAB — SEDIMENTATION RATE: Sed Rate: 15 mm/hr (ref 0–30)

## 2021-08-09 LAB — URIC ACID: Uric Acid, Serum: 6.9 mg/dL (ref 2.4–7.0)

## 2021-08-09 LAB — IBC PANEL
Iron: 61 ug/dL (ref 42–145)
Saturation Ratios: 17.4 % — ABNORMAL LOW (ref 20.0–50.0)
TIBC: 350 ug/dL (ref 250.0–450.0)
Transferrin: 250 mg/dL (ref 212.0–360.0)

## 2021-08-09 LAB — VITAMIN B12: Vitamin B-12: 324 pg/mL (ref 211–911)

## 2021-08-09 LAB — FERRITIN: Ferritin: 118.5 ng/mL (ref 10.0–291.0)

## 2021-08-09 IMAGING — DX DG LUMBAR SPINE 2-3V
3 series · 3 of 3 positions shown · non-contrast
Comparison: Report from radiograph [DATE], images not
available.

CLINICAL DATA: Lumbar spine pain. Chronic right-sided low back
pain.

EXAM:
LUMBAR SPINE - 2-3 VIEW

[l-spine ap]
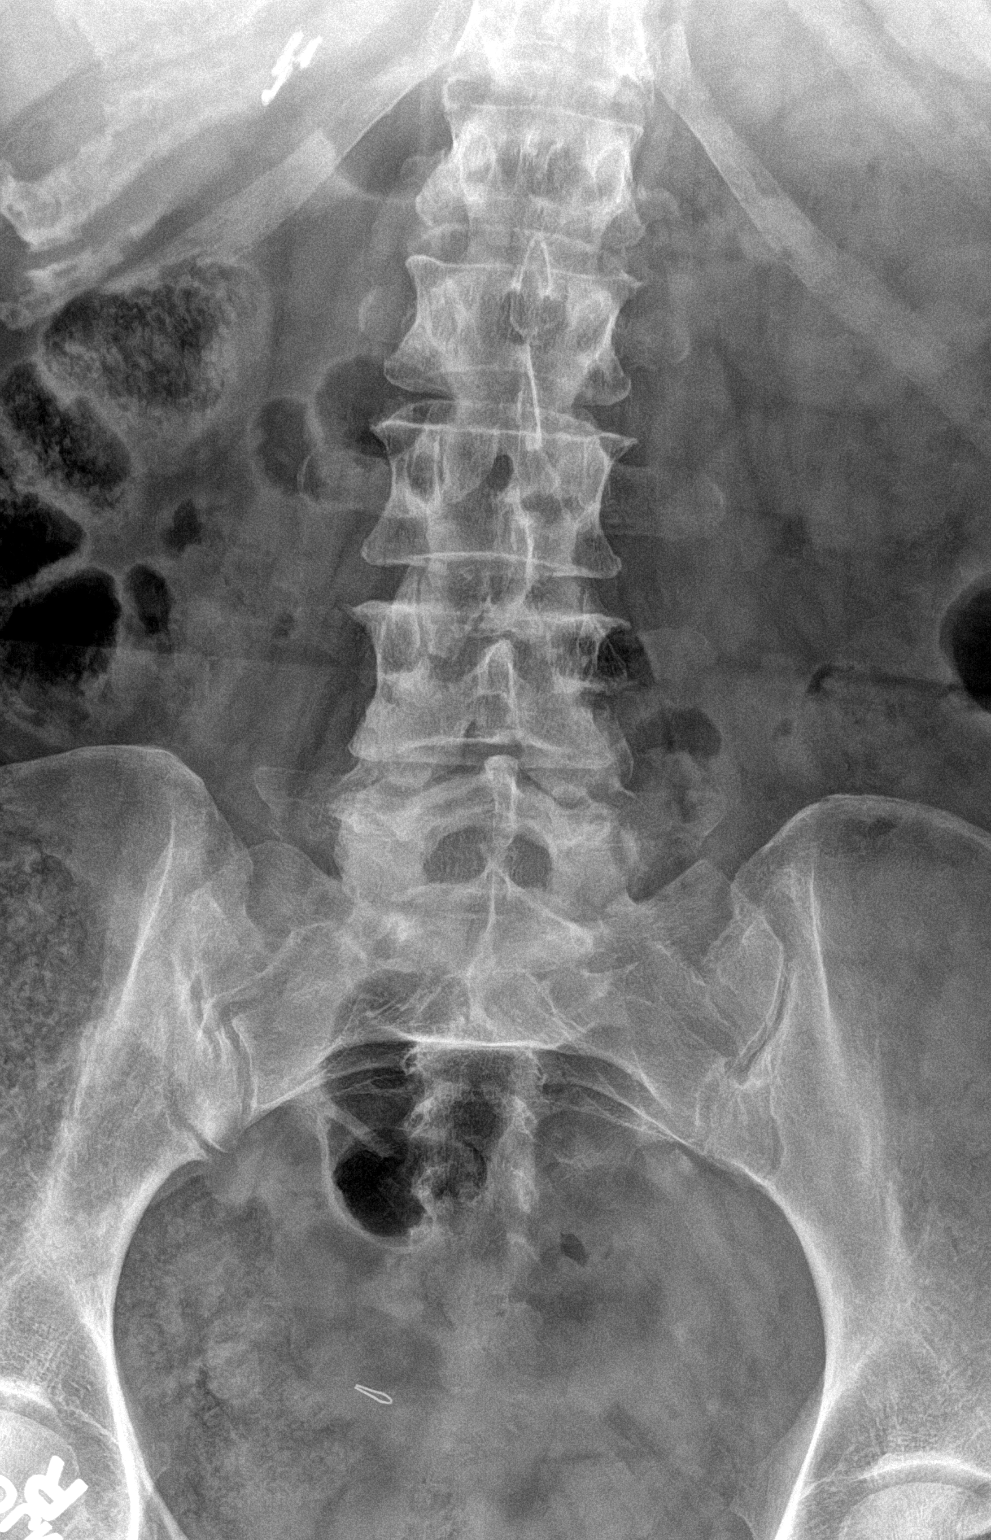

[l-spine lateral]
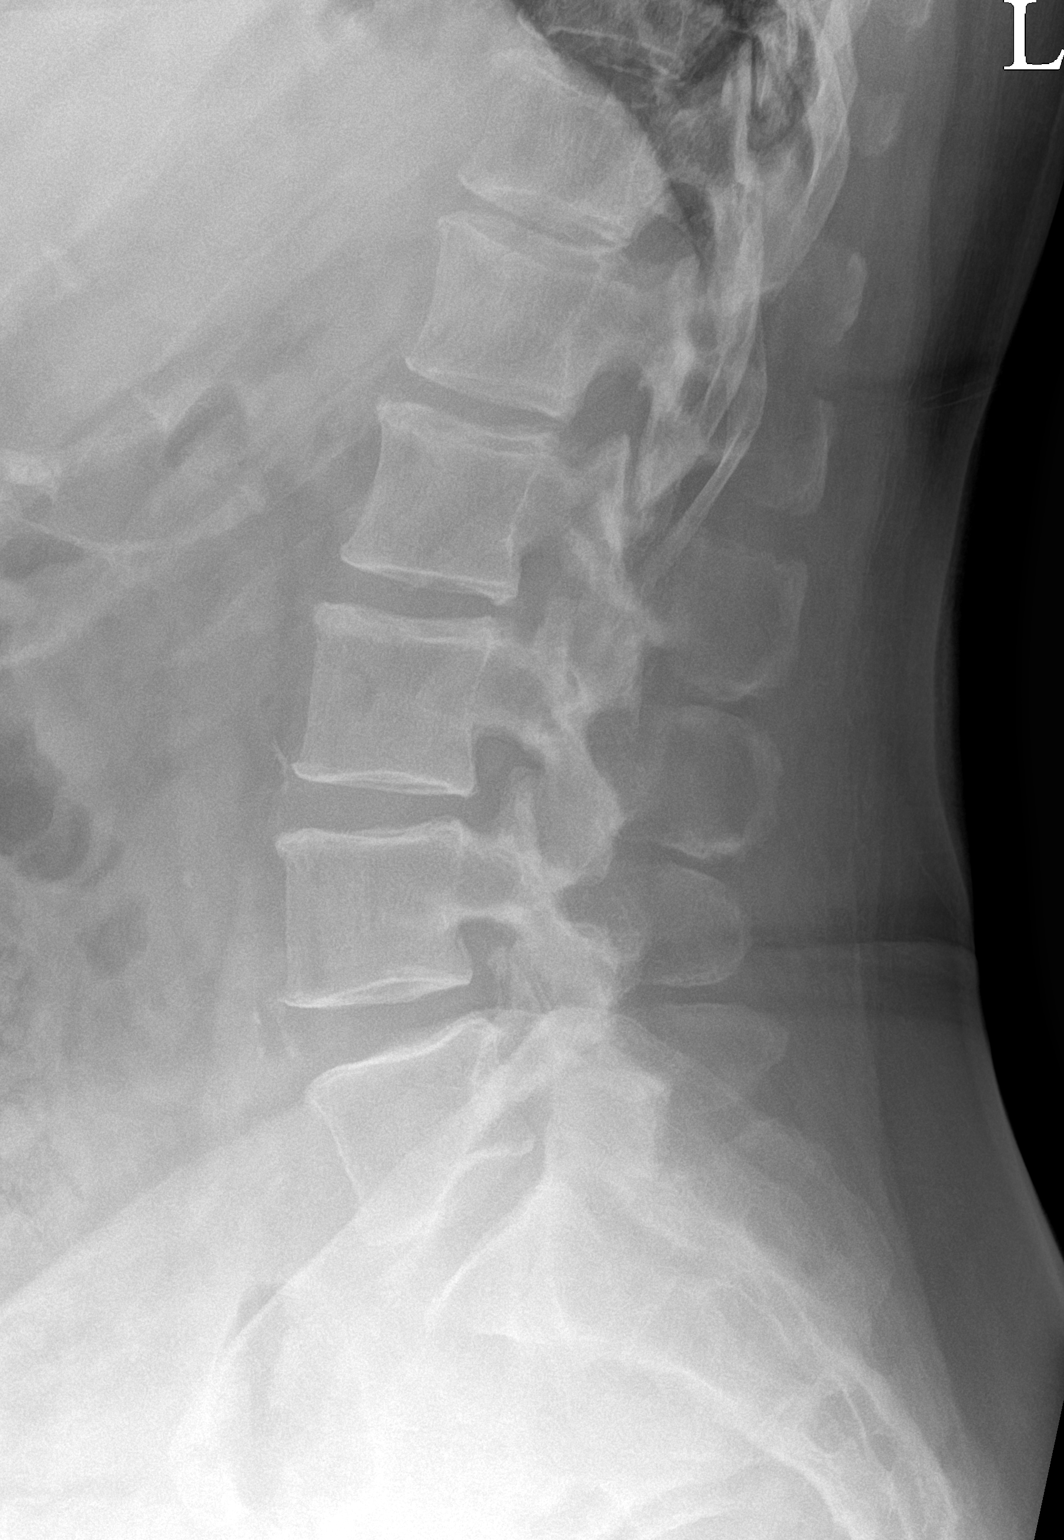

[l-spine spot]
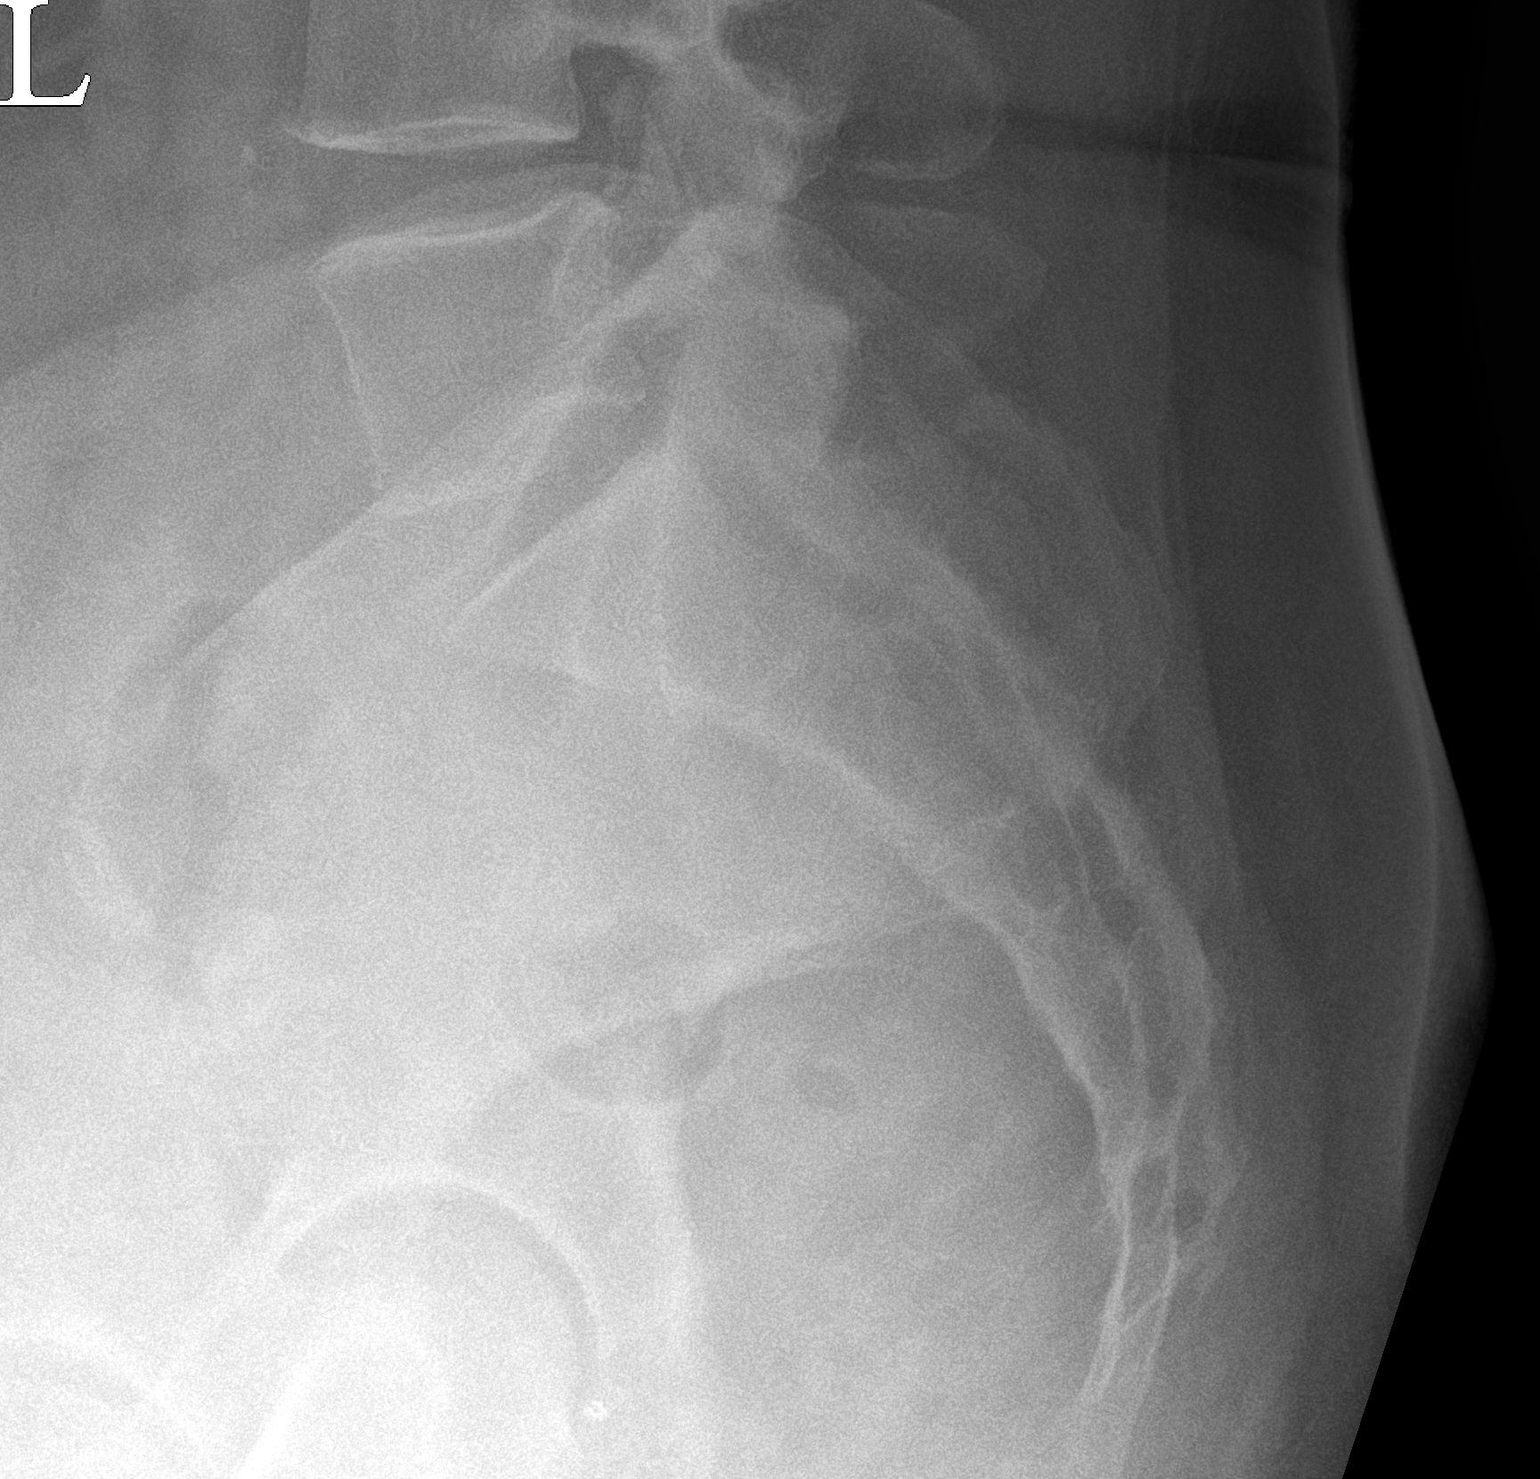

[3 of 3 positions shown; findings below may reference images not displayed]

FINDINGS: There are 5 lumbar type vertebra. Mild broad-based dextroscoliotic
curvature. No listhesis. Vertebral body heights are normal. Mild
endplate spurring and occasional Schmorl's nodes preservation of
disc spaces. There is mild L4-L5 and L5-S1 facet hypertrophy. No
evidence of fracture, focal bone lesion or bony destruction. The
sacroiliac joints are congruent.
IMPRESSION: 1. Mild broad-based dextroscoliotic curvature.
2. Mild multilevel spondylosis with endplate spurring and Schmorl's
nodes.
3. Mild L4-L5 and L5-S1 facet hypertrophy.
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
## 2021-08-09 NOTE — Patient Instructions (Addendum)
Hip flexor exercises  ?Xray today ?Lab today ?MRI lumbar and pelvis [phone] ?Have appt in 4 weeks ? ?

## 2021-08-09 NOTE — Assessment & Plan Note (Signed)
Patient has been diagnosed with ankylosing spondylitis.  Patient has had increasing symptoms.  We will get repeat x-rays.  He does have significant limited range of motion with some mild radicular symptoms also noted today.  Pain is affecting daily activities and giving her pain at night as well.  Patient has already failed formal physical therapy on multiple occasions most recently within the last year.  Having to take pain medication as well sometimes.  Would like patient to have a MRI to further evaluate.  Patient is also having difficulty with muscle spasticity as well as myalgias and will get some laboratory work-up to see if anything else could be contributing.  Patient is trying hard to continue to stay active otherwise.  Follow-up with me again in 6 to 8 weeks. ?

## 2021-08-11 LAB — PTH, INTACT AND CALCIUM
Calcium: 9.6 mg/dL (ref 8.6–10.4)
PTH: 27 pg/mL (ref 16–77)

## 2021-08-15 ENCOUNTER — Ambulatory Visit
Admission: RE | Admit: 2021-08-15 | Discharge: 2021-08-15 | Disposition: A | Discharge status: Home / Self Care | Payer: BC Managed Care – PPO | Source: Ambulatory Visit | Attending: Family Medicine | Admitting: Family Medicine

## 2021-08-15 DIAGNOSIS — M545 Low back pain, unspecified: Secondary | ICD-10-CM | POA: Diagnosis not present

## 2021-08-15 DIAGNOSIS — M48061 Spinal stenosis, lumbar region without neurogenic claudication: Secondary | ICD-10-CM | POA: Diagnosis not present

## 2021-08-15 DIAGNOSIS — M5136 Other intervertebral disc degeneration, lumbar region: Secondary | ICD-10-CM | POA: Diagnosis not present

## 2021-08-15 IMAGING — MR MR LUMBAR SPINE W/O CM
4 of 6 series · 22 of 48 positions shown · non-contrast
Comparison: Radiographs [DATE].

CLINICAL DATA: Lumbar spine pain [1R] ([1R]-CM). Spinal
stenosis, lumbar.

EXAM:
MRI LUMBAR SPINE WITHOUT CONTRAST
TECHNIQUE: Multiplanar, multisequence MR imaging of the lumbar spine was
performed. No intravenous contrast was administered.

[Series 6: T2 · sagittal · 4.0mm · 0.73mm/px · 5 of 17 slices shown (1 of 3)]
[im 1/17]
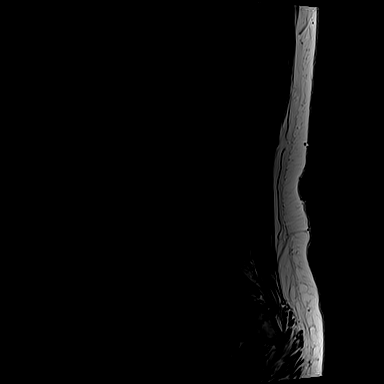
[im 5/17]
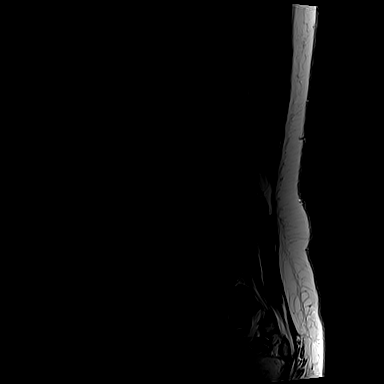
[im 9/17]
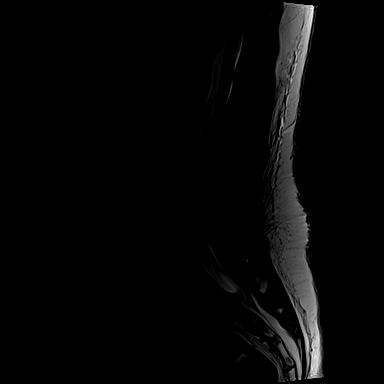
[im 13/17]
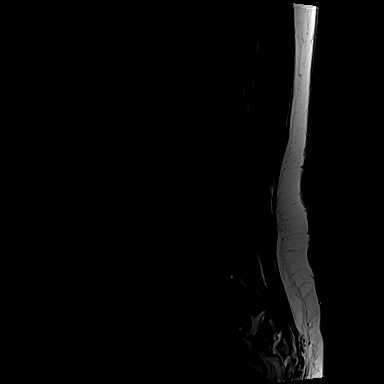
[im 17/17]
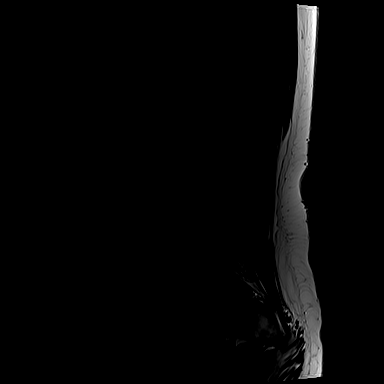

[Series 7: T1 · sagittal · 4.0mm · 0.73mm/px · 4 of 17 slices shown]
[im 1/17]
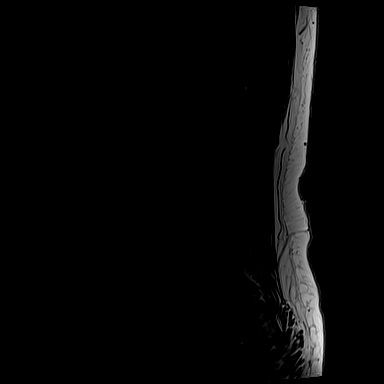
[im 5/17]
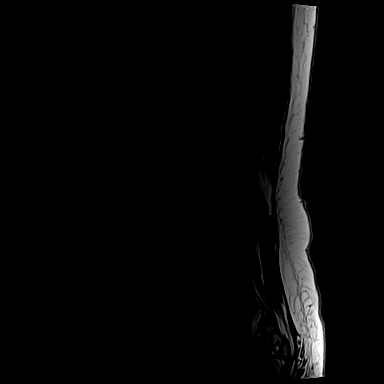
[im 9/17]
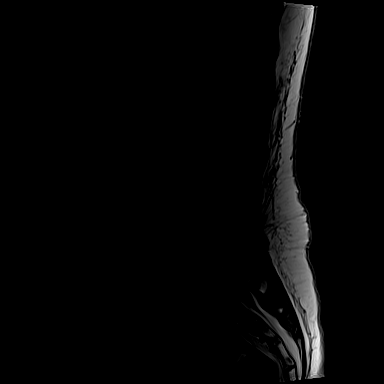
[im 17/17]
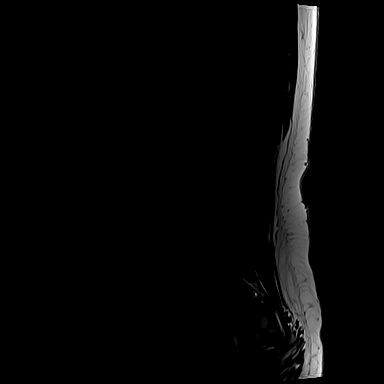

[Series 14: T2 · axial · 4.0mm · 0.28mm/px · z∈[+38,+265]mm · 8 of 46 slices shown (2 of 3)]
[im 1/46]
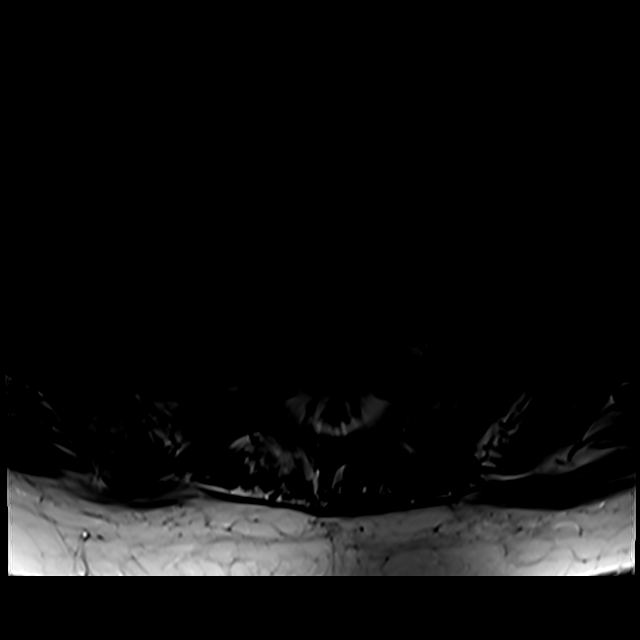
[im 7/46]
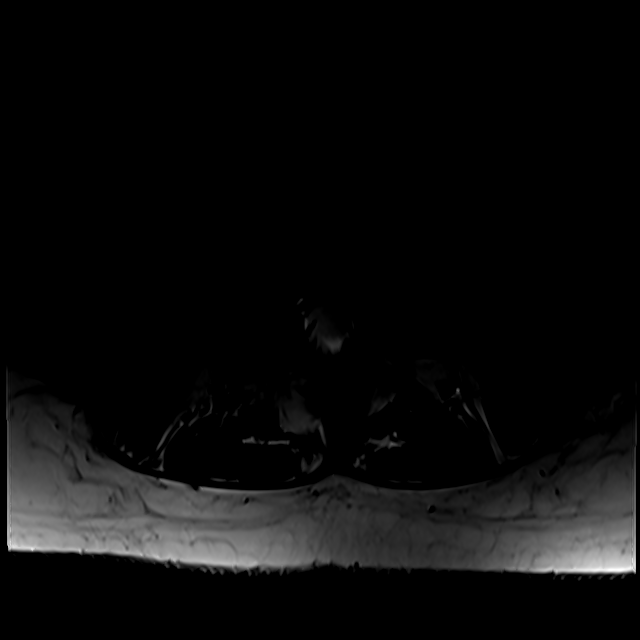
[im 14/46]
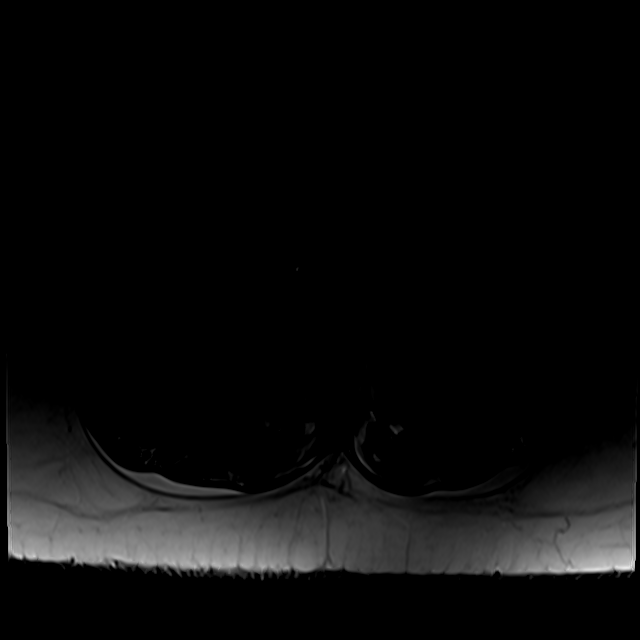
[im 21/46]
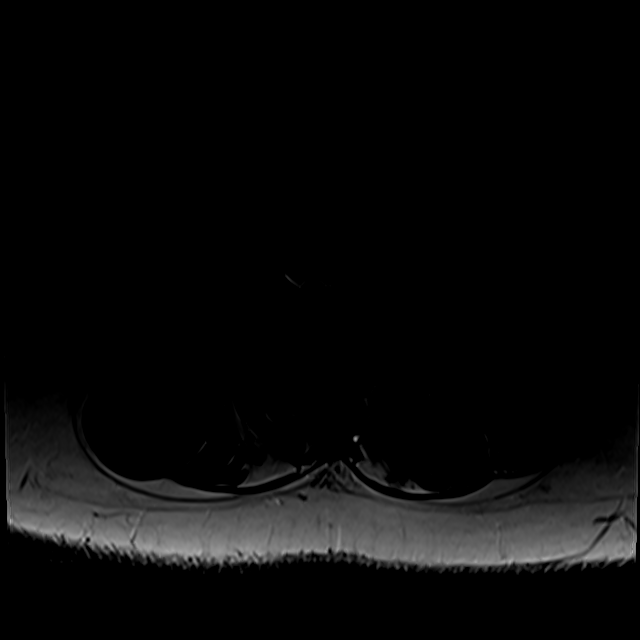
[im 25/46]
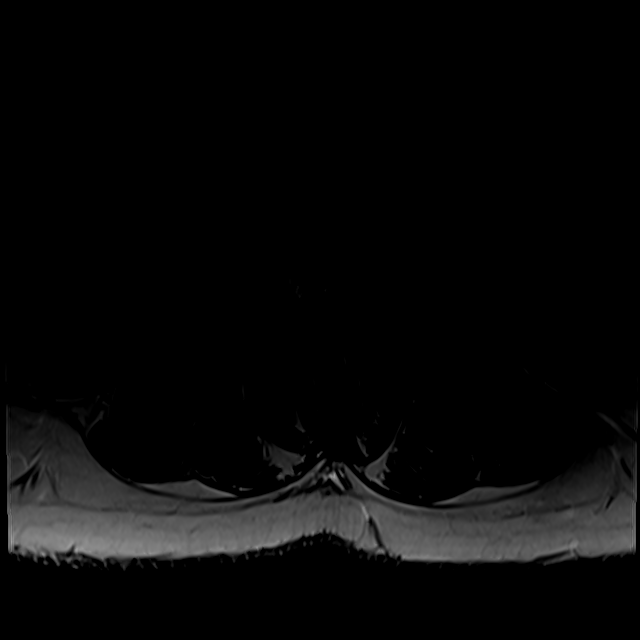
[im 32/46]
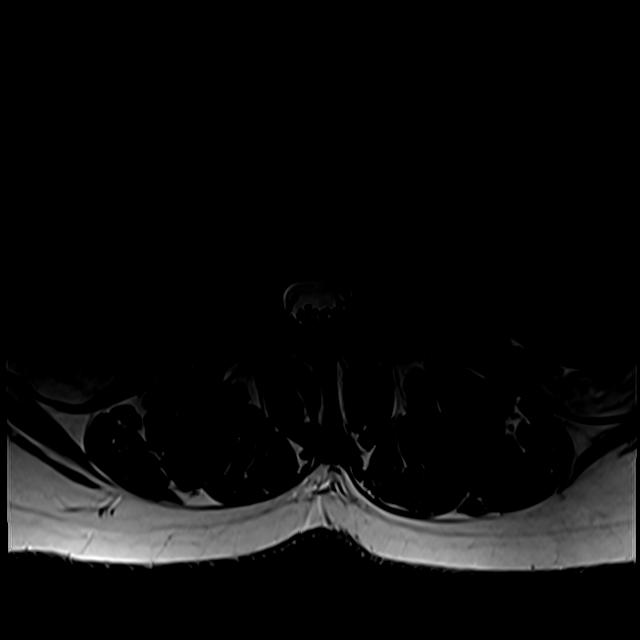
[im 39/46]
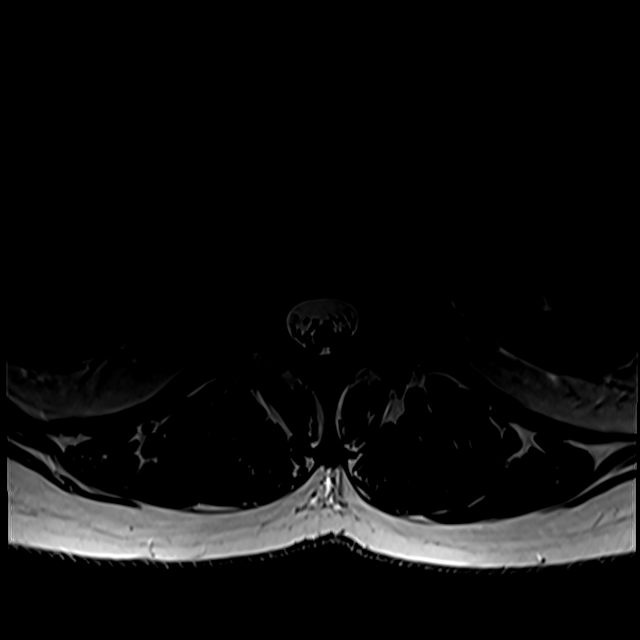
[im 46/46]
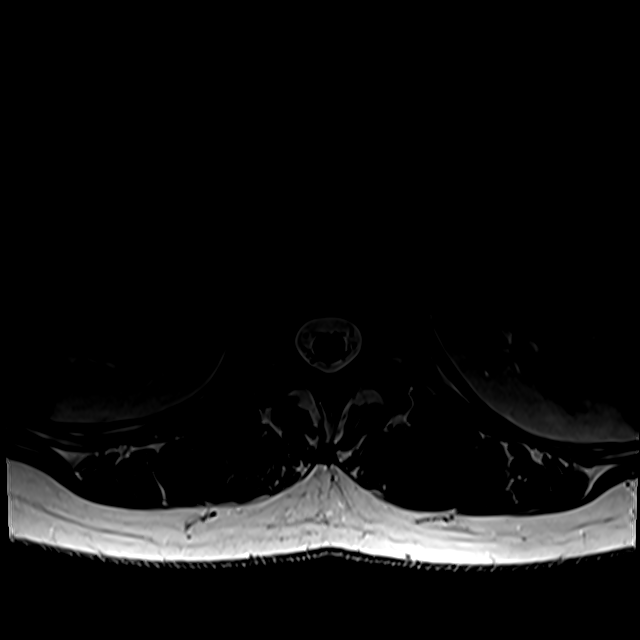

[Series 15: T2 · coronal · 5.0mm · 0.73mm/px · 5 of 17 slices shown (3 of 3)]
[im 1/17]
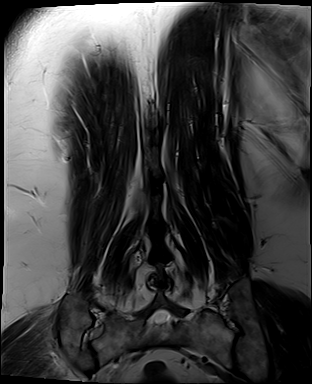
[im 5/17]
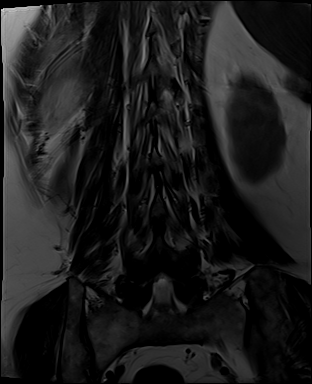
[im 9/17]
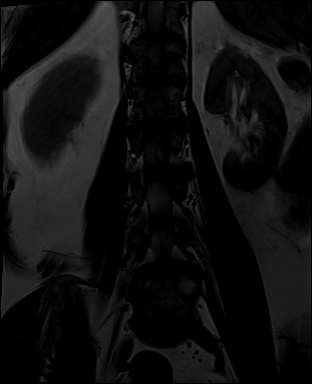
[im 13/17]
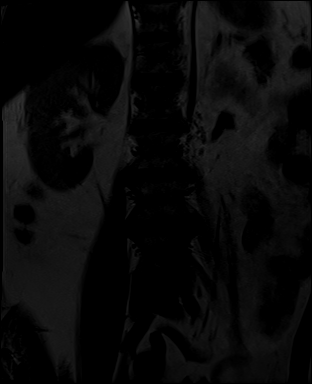
[im 17/17]
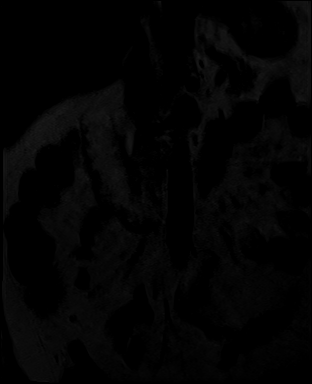

[22 of 48 positions shown; findings below may reference images not displayed]

FINDINGS: Segmentation:  Standard.

Alignment:  Physiologic.

Vertebrae: No fracture, evidence of discitis, or aggressive bone
lesion.

Conus medullaris and cauda equina: Conus extends to the L2 level.
Conus and cauda equina appear normal.

Paraspinal and other soft tissues: Negative.

Disc levels:

T12-L1: No spinal canal or neural stenosis.

L1-2: Mild facet degenerative changes. No spinal canal or neural
foraminal stenosis.

L2-3: Shallow disc bulge and mild facet degenerative changes. No
significant spinal canal or neural foraminal stenosis.

L3-4: Shallow disc bulge and mild to moderate facet degenerative
changes. No significant spinal canal or neural foraminal stenosis.

L4-5: Disc bulge, moderate facet degenerative changes ligamentum
flavum redundancy resulting in mild spinal canal stenosis. No
significant neural foraminal narrowing.

L5-S1: Shallow disc bulge and moderate facet degenerative changes
with ligamentum flavum redundancy, right greater than left without
significant spinal canal or neural foraminal stenosis.
IMPRESSION: 1. Mild degenerative changes of the lumbar spine, more pronounced at
the level of the facet joints in the lower lumbar spine.
2. Mild spinal canal stenosis at L4-5.
3. No high-grade spinal canal or neural foraminal stenosis at any
level.

## 2021-08-15 IMAGING — MR MR PELVIS W/O CM
4 of 5 series · 29 of 48 positions shown · non-contrast
Comparison: None.

CLINICAL DATA: Lower back pain that radiates to both legs.

EXAM:
MRI PELVIS WITHOUT CONTRAST
TECHNIQUE: Multiplanar multisequence MR imaging of the pelvis was performed. No
intravenous contrast was administered.

[Series 3: STIR · coronal · 3.0mm · 1.25mm/px · 9 of 39 slices shown]
[im 1/39]
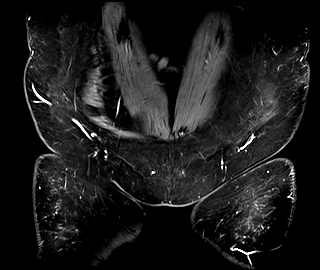
[im 5/39]
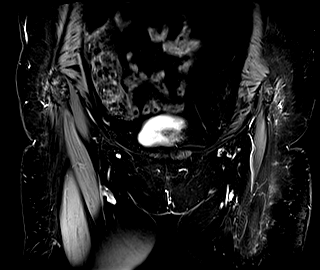
[im 10/39]
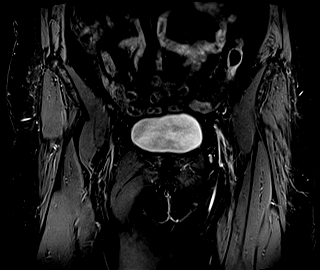
[im 15/39]
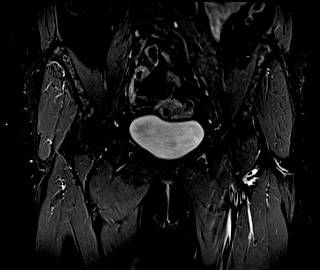
[im 20/39]
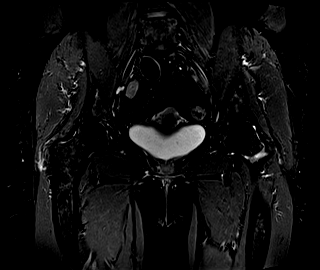
[im 24/39]
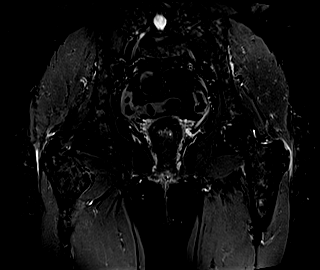
[im 29/39]
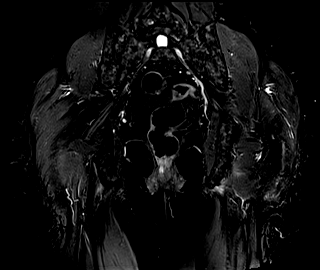
[im 34/39]
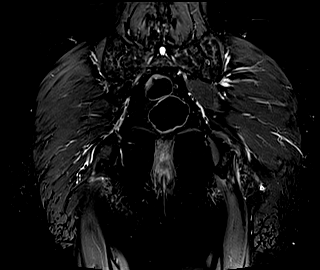
[im 39/39]
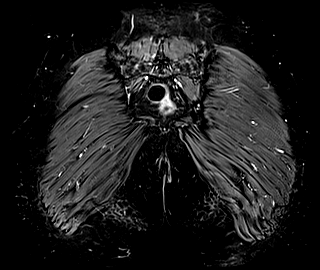

[Series 4: T1 · coronal · 3.0mm · 0.78mm/px · 9 of 39 slices shown (1 of 2)]
[im 1/39]
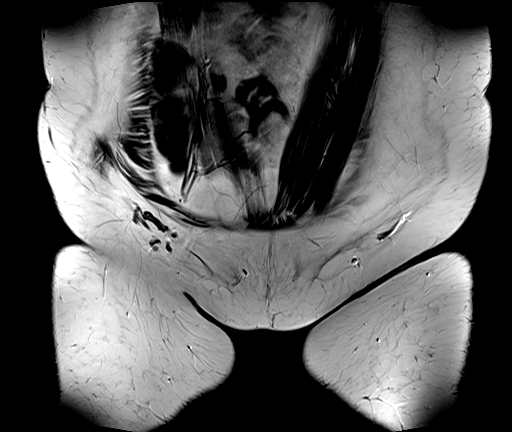
[im 5/39]
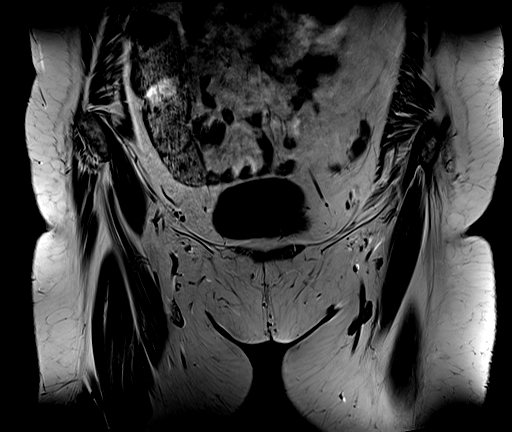
[im 10/39]
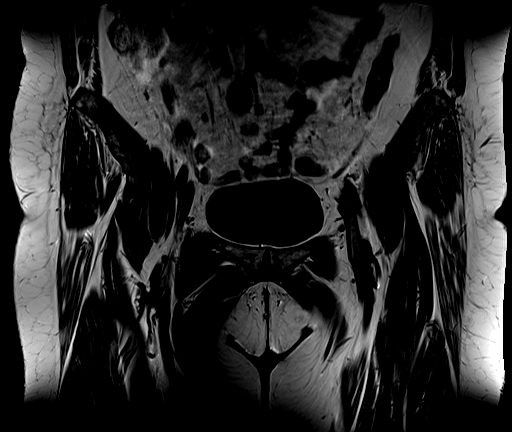
[im 15/39]
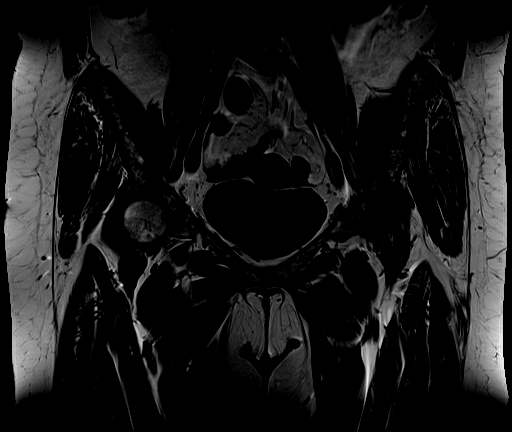
[im 20/39]
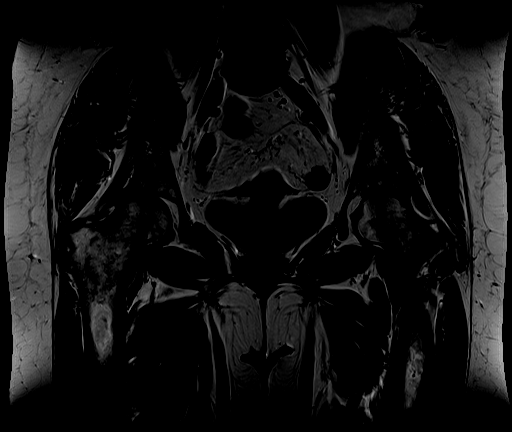
[im 24/39]
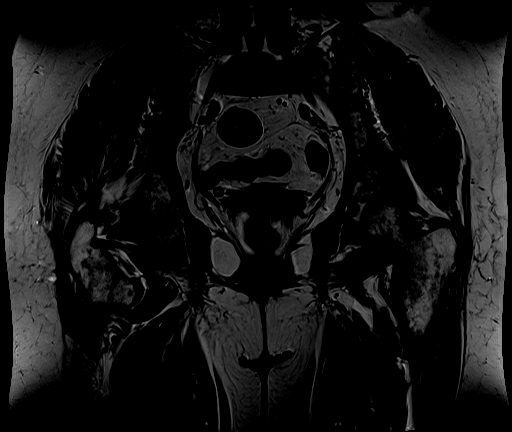
[im 29/39]
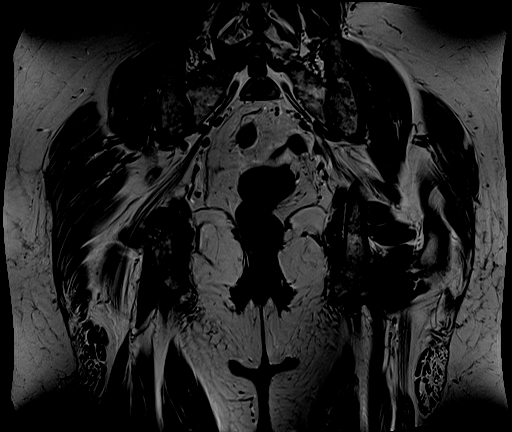
[im 34/39]
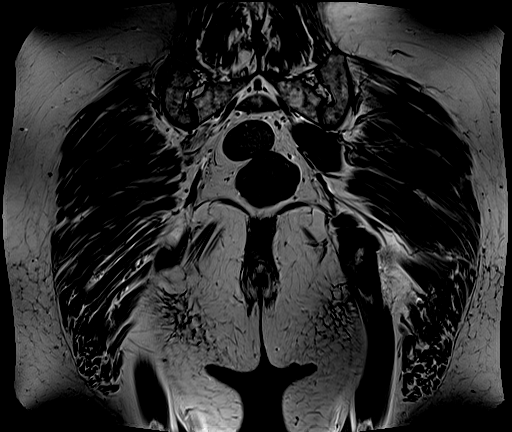
[im 39/39]
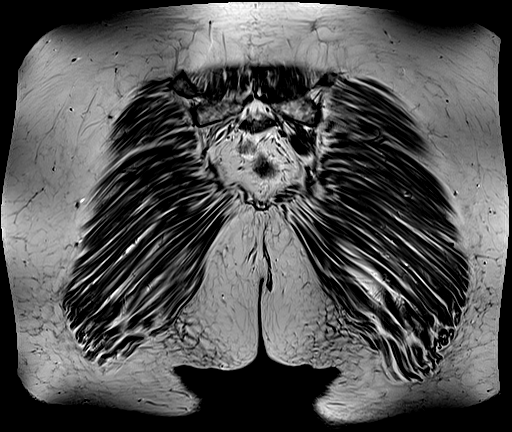

[Series 5: T2 fat-sat · sagittal · 5.0mm · 0.78mm/px · 8 of 51 slices shown]
[im 1/51]
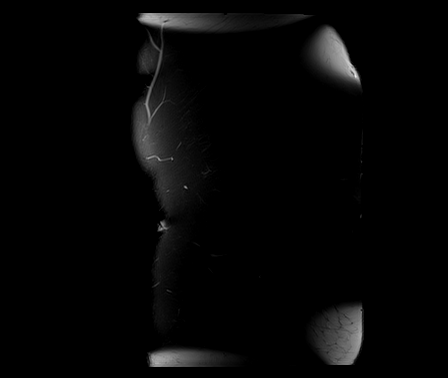
[im 10/51]
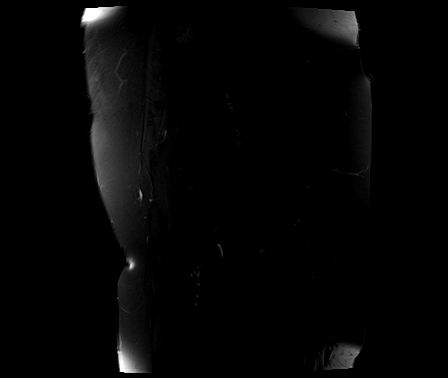
[im 14/51]
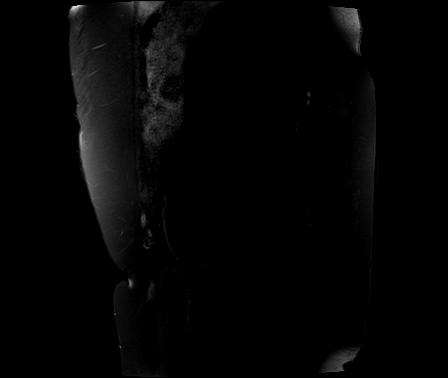
[im 23/51]
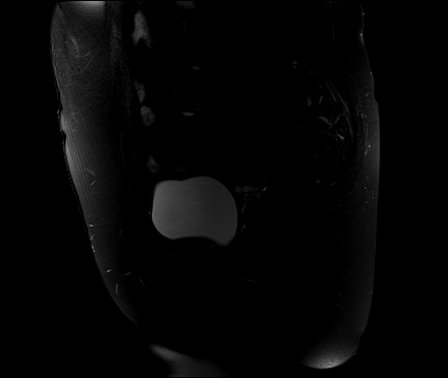
[im 28/51]
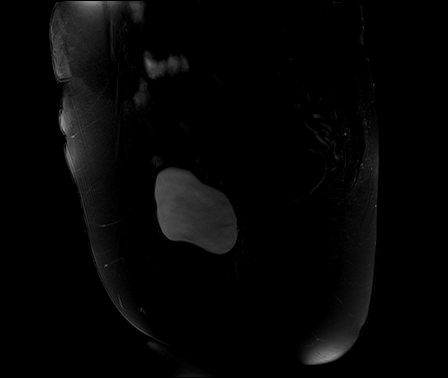
[im 37/51]
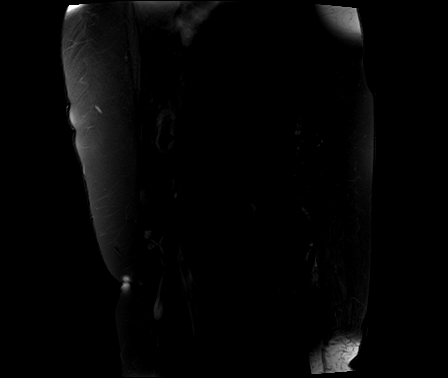
[im 41/51]
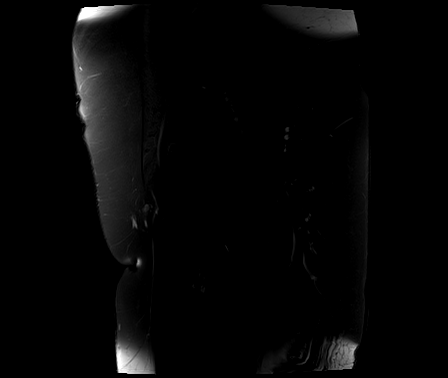
[im 46/51]
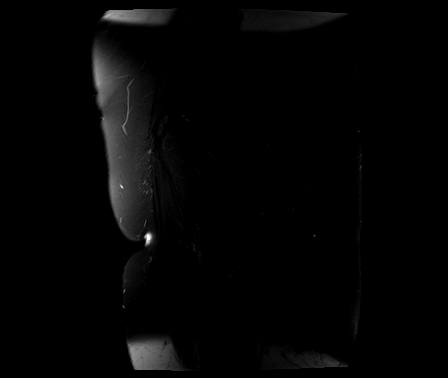

[Series 6: T1 · axial · 5.0mm · 0.78mm/px · z∈[-48,+134]mm · 3 of 36 slices shown (2 of 2)]
[im 5/36]
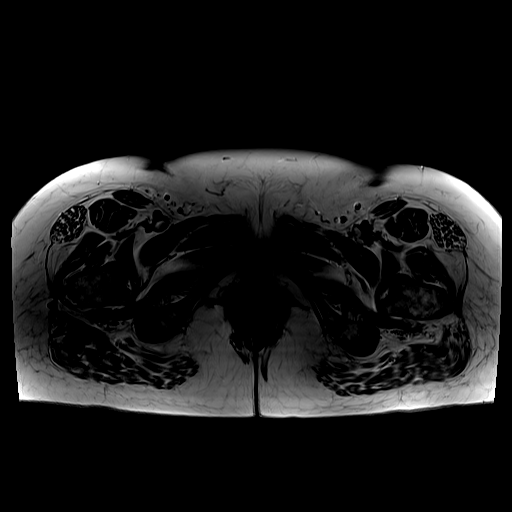
[im 18/36]
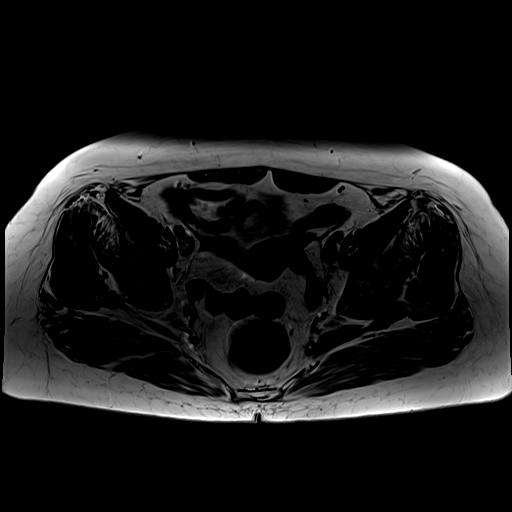
[im 31/36]
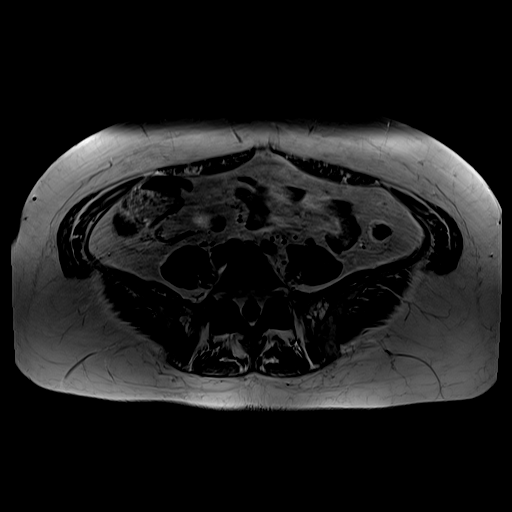

[29 of 48 positions shown; findings below may reference images not displayed]

FINDINGS: Bones:

No hip fracture, dislocation or avascular necrosis.

No periosteal reaction or bone destruction. No aggressive osseous
lesion.

Normal sacrum and sacroiliac joints. No SI joint widening or erosive
changes.

Articular cartilage and labrum

Articular cartilage:  No chondral defect.

Labrum: Grossly intact, but evaluation is limited by lack of
intraarticular fluid.

Joint or bursal effusion

Joint effusion:  No hip joint effusion.  No SI joint effusion.

Bursae:  No bursa formation.

Muscles and tendons

There is bilateral tendinopathy at the gluteus minimus and medius
insertion, right greater than the left. There is mild tendinopathy
at the origin of bilateral hamstrings. Iliopsoas tendons are
unremarkable. No muscle atrophy.

Other findings

No pelvic free fluid. No fluid collection or hematoma. No inguinal
lymphadenopathy. No inguinal hernia.
IMPRESSION: 1.  No evidence of fracture, dislocation or avascular necrosis.

2.  No evidence of significant hip arthropathy or joint effusion.

3. Mild bilateral gluteus medius/minimus tendinopathy, right worse
than the left. Mild bilateral hamstring origin tendinopathy. No
evidence of muscle strain or atrophy.

4.  Sacroiliac joints and pubic symphysis are unremarkable.

## 2021-08-17 ENCOUNTER — Other Ambulatory Visit: Payer: Self-pay

## 2021-08-17 ENCOUNTER — Encounter: Payer: Self-pay | Admitting: Family Medicine

## 2021-08-17 DIAGNOSIS — M5416 Radiculopathy, lumbar region: Secondary | ICD-10-CM

## 2021-08-19 ENCOUNTER — Other Ambulatory Visit: Payer: Self-pay | Admitting: Family Medicine

## 2021-08-19 ENCOUNTER — Other Ambulatory Visit: Payer: Self-pay

## 2021-08-19 ENCOUNTER — Ambulatory Visit
Admission: RE | Admit: 2021-08-19 | Discharge: 2021-08-19 | Disposition: A | Discharge status: Home / Self Care | Payer: BC Managed Care – PPO | Source: Ambulatory Visit | Attending: Family Medicine | Admitting: Family Medicine

## 2021-08-19 DIAGNOSIS — M5416 Radiculopathy, lumbar region: Secondary | ICD-10-CM

## 2021-08-19 DIAGNOSIS — M4727 Other spondylosis with radiculopathy, lumbosacral region: Secondary | ICD-10-CM | POA: Diagnosis not present

## 2021-08-19 IMAGING — XA Imaging study
2 series · 2 of 2 positions shown · non-contrast
Comparison: none

CLINICAL DATA: Lumbosacral spondylosis without myelopathy with
radiculopathy. Pain in the low back, hips, and thighs, right greater
than left.

[Series 1: ortho standard · 1 of 1 slices shown (1 of 2)]
[im 1/1]
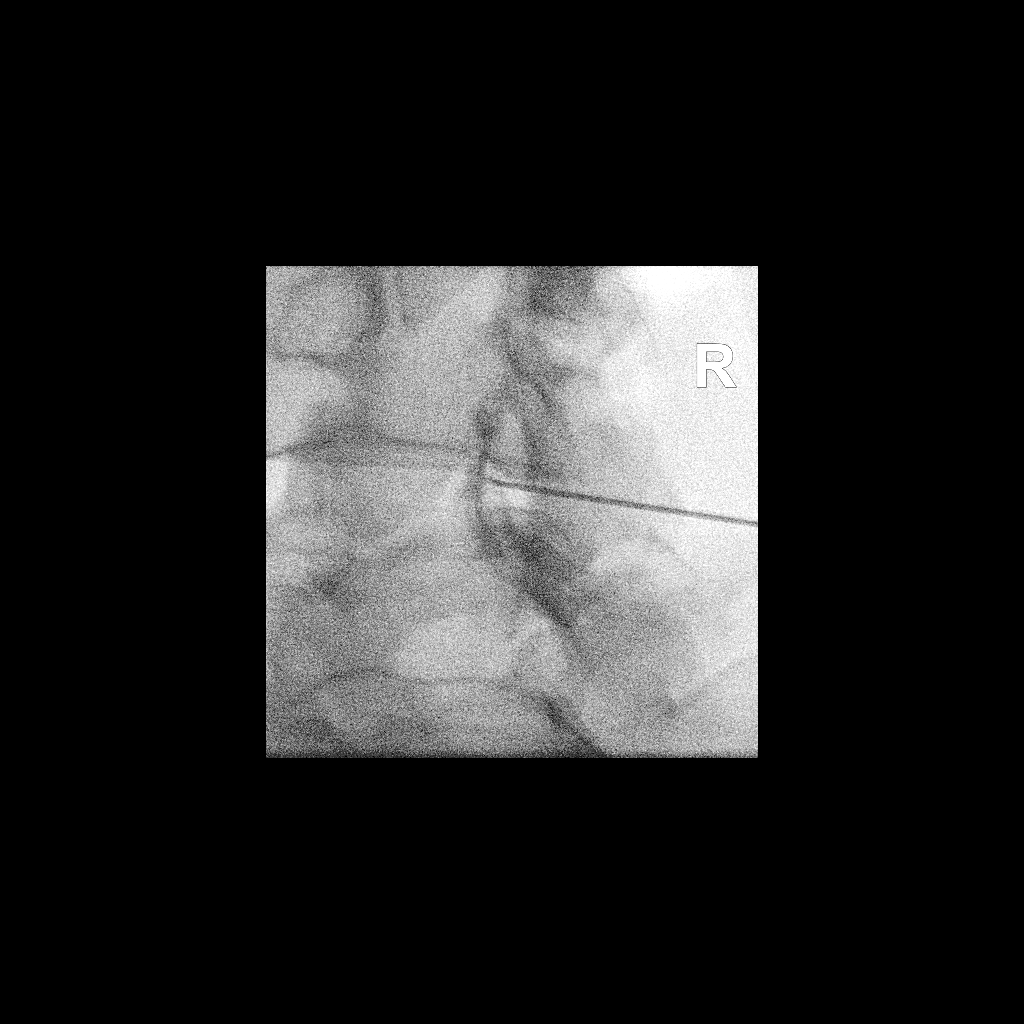

[Series 2: ortho standard · 1 of 1 slices shown (2 of 2)]
[im 1/1]
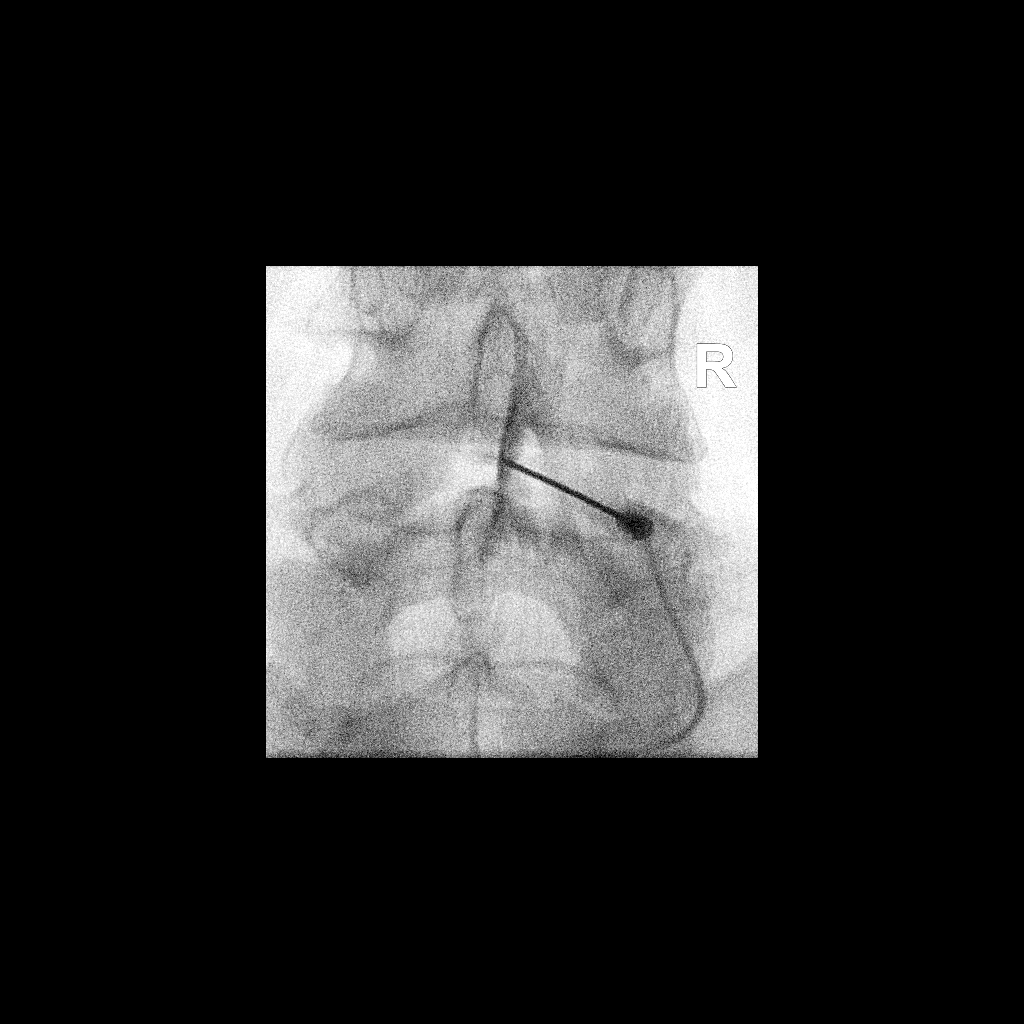

[2 of 2 positions shown; findings below may reference images not displayed]

FLUOROSCOPY:
Radiation Exposure Index (as provided by the fluoroscopic device):
1.0 mGy Kerma

PROCEDURE:
The procedure, risks, benefits, and alternatives were explained to
the patient. Questions regarding the procedure were encouraged and
answered. The patient understands and consents to the procedure.

LUMBAR EPIDURAL INJECTION:

An interlaminar approach was performed on the right at L4-5. The
overlying skin was cleansed and anesthetized. A 3.5 inch 20 gauge
epidural needle was advanced using loss-of-resistance technique.

DIAGNOSTIC EPIDURAL INJECTION:

Injection of Isovue-M 200 shows a good epidural pattern with spread
above and below the level of needle placement, primarily on the
right. No vascular opacification is seen.

THERAPEUTIC EPIDURAL INJECTION:

80 mg of Depo-Medrol mixed with 3 mL of 1% lidocaine were instilled.
The procedure was well-tolerated, and the patient was discharged
thirty minutes following the injection in good condition.

COMPLICATIONS:
None immediate
IMPRESSION: Technically successful interlaminar epidural injection on the right
at L4-5.

## 2021-08-19 MED ORDER — IOPAMIDOL (ISOVUE-M 200) INJECTION 41%: 1.0000 mL | Freq: Once | INTRAMUSCULAR | Status: DC

## 2021-08-19 NOTE — Discharge Instructions (Signed)

## 2021-08-29 DIAGNOSIS — Z1231 Encounter for screening mammogram for malignant neoplasm of breast: Secondary | ICD-10-CM | POA: Diagnosis not present

## 2021-08-30 ENCOUNTER — Ambulatory Visit: Payer: BC Managed Care – PPO | Admitting: Family Medicine

## 2021-09-01 DIAGNOSIS — Z01419 Encounter for gynecological examination (general) (routine) without abnormal findings: Secondary | ICD-10-CM | POA: Diagnosis not present

## 2021-09-06 ENCOUNTER — Ambulatory Visit (AMBULATORY_SURGERY_CENTER): Payer: BC Managed Care – PPO | Admitting: *Deleted

## 2021-09-06 VITALS — Ht 68.0 in | Wt 200.0 lb

## 2021-09-06 DIAGNOSIS — Z86010 Personal history of colonic polyps: Secondary | ICD-10-CM

## 2021-09-06 MED ORDER — NA SULFATE-K SULFATE-MG SULF 17.5-3.13-1.6 GM/177ML PO SOLN: 2.0000 | Freq: Once | ORAL | 0 refills | Status: AC

## 2021-09-06 NOTE — Progress Notes (Signed)
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
## 2021-09-08 ENCOUNTER — Ambulatory Visit (INDEPENDENT_AMBULATORY_CARE_PROVIDER_SITE_OTHER): Payer: BC Managed Care – PPO | Admitting: Family Medicine

## 2021-09-08 DIAGNOSIS — M48062 Spinal stenosis, lumbar region with neurogenic claudication: Secondary | ICD-10-CM

## 2021-09-08 NOTE — Patient Instructions (Signed)
Good to see you! ?So glad you got your life back ?Keep doing strength exercises ?See you again in 6-8 weeks ?If not better we will do injections ?

## 2021-09-08 NOTE — Assessment & Plan Note (Signed)
Patient has responded very well.  Does have some gluteus tendinitis.  Will monitor.  Did discuss that this could be more secondary to the neurologic chronic.  Discussed icing regimen and home exercises, increase activity slowly.  Follow-up again in 6 to 8 weeks.  If worsening pain will consider the injections into the gluteal tendons. ?

## 2021-09-16 ENCOUNTER — Encounter: Payer: Self-pay | Admitting: Internal Medicine

## 2021-09-27 ENCOUNTER — Ambulatory Visit (AMBULATORY_SURGERY_CENTER): Payer: BC Managed Care – PPO | Admitting: Internal Medicine

## 2021-09-27 ENCOUNTER — Encounter: Payer: Self-pay | Admitting: Internal Medicine

## 2021-09-27 VITALS — BP 116/72 | HR 73 | Temp 97.3°F | Resp 16 | Ht 68.0 in | Wt 200.0 lb

## 2021-09-27 DIAGNOSIS — Z1211 Encounter for screening for malignant neoplasm of colon: Secondary | ICD-10-CM | POA: Diagnosis not present

## 2021-09-27 DIAGNOSIS — Z86010 Personal history of colonic polyps: Secondary | ICD-10-CM | POA: Diagnosis not present

## 2021-09-27 MED ORDER — SODIUM CHLORIDE 0.9 % IV SOLN: 500.0000 mL | Freq: Once | INTRAVENOUS | Status: DC

## 2021-09-27 NOTE — Op Note (Signed)
[place] ?Patient Name: Tonya Elliott ?Procedure Date: 09/27/2021 7:22 AM ?MRN: [id number] ?Endoscopist: Docia Chuck. Henrene Pastor , MD ?Age: 56 ?Referring MD:  ?Date of Birth: [birth year] ?Gender: Female ?Account #: [id number] ?Procedure:                Colonoscopy ?Indications:              High risk colon cancer surveillance: Personal  ?                          history of adenoma (10 mm or greater in size), High  ?                          risk colon cancer surveillance: Personal history of  ?                          multiple (3 or more) adenomas. Previous  ?                          examinations 2017 (piecemeal, tattoo), 2018, 2019 ?Medicines:                Monitored Anesthesia Care ?Procedure:                Pre-Anesthesia Assessment: ?                          - Prior to the procedure, a History and Physical  ?                          was performed, and patient medications and  ?                          allergies were reviewed. The patient's tolerance of  ?                          previous anesthesia was also reviewed. The risks  ?                          and benefits of the procedure and the sedation  ?                          options and risks were discussed with the patient.  ?                          All questions were answered, and informed consent  ?                          was obtained. Prior Anticoagulants: The patient has  ?                          taken no previous anticoagulant or antiplatelet  ?                          agents. After reviewing the risks and benefits, the  ?  patient was deemed in satisfactory condition to  ?                          undergo the procedure. ?                          After obtaining informed consent, the colonoscope  ?                          was passed under direct vision. Throughout the  ?                          procedure, the patient's blood pressure, pulse, and  ?                          oxygen saturations were monitored  continuously. The  ?                          CF [id number] #[id number] was introduced through the anus  ?                          and advanced to the the cecum, identified by  ?                          appendiceal orifice and ileocecal valve. The  ?                          ileocecal valve, appendiceal orifice, and rectum  ?                          were photographed. The quality of the bowel  ?                          preparation was excellent. The colonoscopy was  ?                          performed without difficulty. The patient tolerated  ?                          the procedure well. The bowel preparation used was  ?                          SUPREP via split dose instruction. ?Scope In: 8:52:27 AM ?Scope Out: 3:24:40 AM ?Scope Withdrawal Time: 0 hours 9 minutes 44 seconds  ?Total Procedure Duration: 0 hours 13 minutes 37 seconds  ?Findings:                 The entire examined colon appeared normal on direct  ?                          and retroflexion views. Previous marking tattoo in  ?                          the region of the proximal left colon noted. ?Complications:  No immediate complications. Estimated blood loss:  ?                          None. ?Estimated Blood Loss:     Estimated blood loss: none. ?Impression:               - The entire examined colon is normal on direct and  ?                          retroflexion views. ?                          - No specimens collected. ?Recommendation:           - Repeat colonoscopy in 5 years for surveillance. ?                          - Patient has a contact number available for  ?                          emergencies. The signs and symptoms of potential  ?                          delayed complications were discussed with the  ?                          patient. Return to normal activities tomorrow.  ?                          Written discharge instructions were provided to the  ?                          patient. ?                          -  Resume previous diet. ?                          - Continue present medications. ?Docia Chuck. Henrene Pastor, MD ?09/27/2021 9:22:02 AM ?This report has been signed electronically. ?

## 2021-09-27 NOTE — Progress Notes (Signed)
Report to PACU, RN, vss, BBS= Clear.  
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
## 2021-09-27 NOTE — Progress Notes (Signed)
Pt's states no medical or surgical changes since previsit or office visit. 

## 2021-09-27 NOTE — Patient Instructions (Signed)
YOU HAD AN ENDOSCOPIC PROCEDURE TODAY AT THE [facility]:   Refer to the procedure report that was given to you for any specific questions about what was found during the examination.  If the procedure report does not answer your questions, please call your gastroenterologist to clarify.  If you requested that your care partner not be given the details of your procedure findings, then the procedure report has been included in a sealed envelope for you to review at your convenience later. ° °YOU SHOULD EXPECT: Some feelings of bloating in the abdomen. Passage of more gas than usual.  Walking can help get rid of the air that was put into your GI tract during the procedure and reduce the bloating. If you had a lower endoscopy (such as a colonoscopy or flexible sigmoidoscopy) you may notice spotting of blood in your stool or on the toilet paper. If you underwent a bowel prep for your procedure, you may not have a normal bowel movement for a few days. ° °Please Note:  You might notice some irritation and congestion in your nose or some drainage.  This is from the oxygen used during your procedure.  There is no need for concern and it should clear up in a day or so. ° °SYMPTOMS TO REPORT IMMEDIATELY: ° °Following lower endoscopy (colonoscopy or flexible sigmoidoscopy): ° Excessive amounts of blood in the stool ° Significant tenderness or worsening of abdominal pains ° Swelling of the abdomen that is new, acute ° Fever of 100°F or higher ° °For urgent or emergent issues, a gastroenterologist can be reached at any hour by calling [phone]. °Do not use MyChart messaging for urgent concerns.  ° ° °DIET:  We do recommend a small meal at first, but then you may proceed to your regular diet.  Drink plenty of fluids but you should avoid alcoholic beverages for 24 hours. ° °ACTIVITY:  You should plan to take it easy for the rest of today and you should NOT DRIVE or use heavy machinery until tomorrow (because of  the sedation medicines used during the test).   ° °FOLLOW UP: °Our staff will call the number listed on your records 48-72 hours following your procedure to check on you and address any questions or concerns that you may have regarding the information given to you following your procedure. If we do not reach you, we will leave a message.  We will attempt to reach you two times.  During this call, we will ask if you have developed any symptoms of COVID 19. If you develop any symptoms (ie: fever, flu-like symptoms, shortness of breath, cough etc.) before then, please call [phone].  If you test positive for Covid 19 in the 2 weeks post procedure, please call and report this information to us.   ° °SIGNATURES/CONFIDENTIALITY: °You and/or your care partner have signed paperwork which will be entered into your electronic medical record.  These signatures attest to the fact that that the information above on your After Visit Summary has been reviewed and is understood.  Full responsibility of the confidentiality of this discharge information lies with you and/or your care-partner.  °

## 2021-09-29 ENCOUNTER — Telehealth: Payer: Self-pay

## 2021-09-29 NOTE — Telephone Encounter (Signed)
?  Follow up Call- ? ? ?  09/27/2021  ?  7:30 AM  ?Call back number  ?Post procedure Call Back phone  # [phone]  ?Permission to leave phone message Yes  ?  ? ?Patient questions: ? ?Do you have a fever, pain , or abdominal swelling? No ?Pain Score  0 * ? ?Have you tolerated food without any problems? Yes ? ?Have you been able to return to your normal activities? Yes ? ?Do you have any questions about your discharge instructions: ?Diet   No. ?Medications  No. ?Follow up visit  No. ? ?Do you have questions or concerns about your Care? No. ? ?Actions: ?* If pain score is 4 or above: ?No action needed, pain <4. ? ? ?
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
## 2021-10-20 ENCOUNTER — Encounter: Payer: Self-pay | Admitting: Family Medicine

## 2021-10-20 ENCOUNTER — Ambulatory Visit: Payer: BC Managed Care – PPO | Admitting: Family Medicine

## 2021-10-20 DIAGNOSIS — M48062 Spinal stenosis, lumbar region with neurogenic claudication: Secondary | ICD-10-CM | POA: Diagnosis not present

## 2021-10-20 NOTE — Assessment & Plan Note (Signed)
Patient is doing significantly better at this time.  Patient has responded extremely well to the treatment plan at this point we discussed the possibility of other things but at this moment patient wants to continue with conservative therapy and follow-up with me again as needed

## 2021-11-07 ENCOUNTER — Encounter: Payer: Self-pay | Admitting: Family Medicine

## 2021-11-07 ENCOUNTER — Other Ambulatory Visit: Payer: Self-pay

## 2021-11-07 DIAGNOSIS — M5416 Radiculopathy, lumbar region: Secondary | ICD-10-CM

## 2021-11-15 ENCOUNTER — Ambulatory Visit
Admission: RE | Admit: 2021-11-15 | Discharge: 2021-11-15 | Disposition: A | Discharge status: Home / Self Care | Payer: BC Managed Care – PPO | Source: Ambulatory Visit | Attending: Family Medicine | Admitting: Family Medicine

## 2021-11-15 DIAGNOSIS — M5416 Radiculopathy, lumbar region: Secondary | ICD-10-CM

## 2021-11-15 DIAGNOSIS — M47817 Spondylosis without myelopathy or radiculopathy, lumbosacral region: Secondary | ICD-10-CM | POA: Diagnosis not present

## 2021-11-15 IMAGING — XA Imaging study
2 series · 2 of 2 positions shown · non-contrast
Comparison: none

CLINICAL DATA: Lumbosacral spondylosis without myelopathy. Good
response to the epidural injection in [DATE]. Recent recurrence of
pain in the low back, hips, and legs, right slightly worse than
left.

[Series 1: ortho standard · 1 of 1 slices shown (1 of 2)]
[im 1/1]
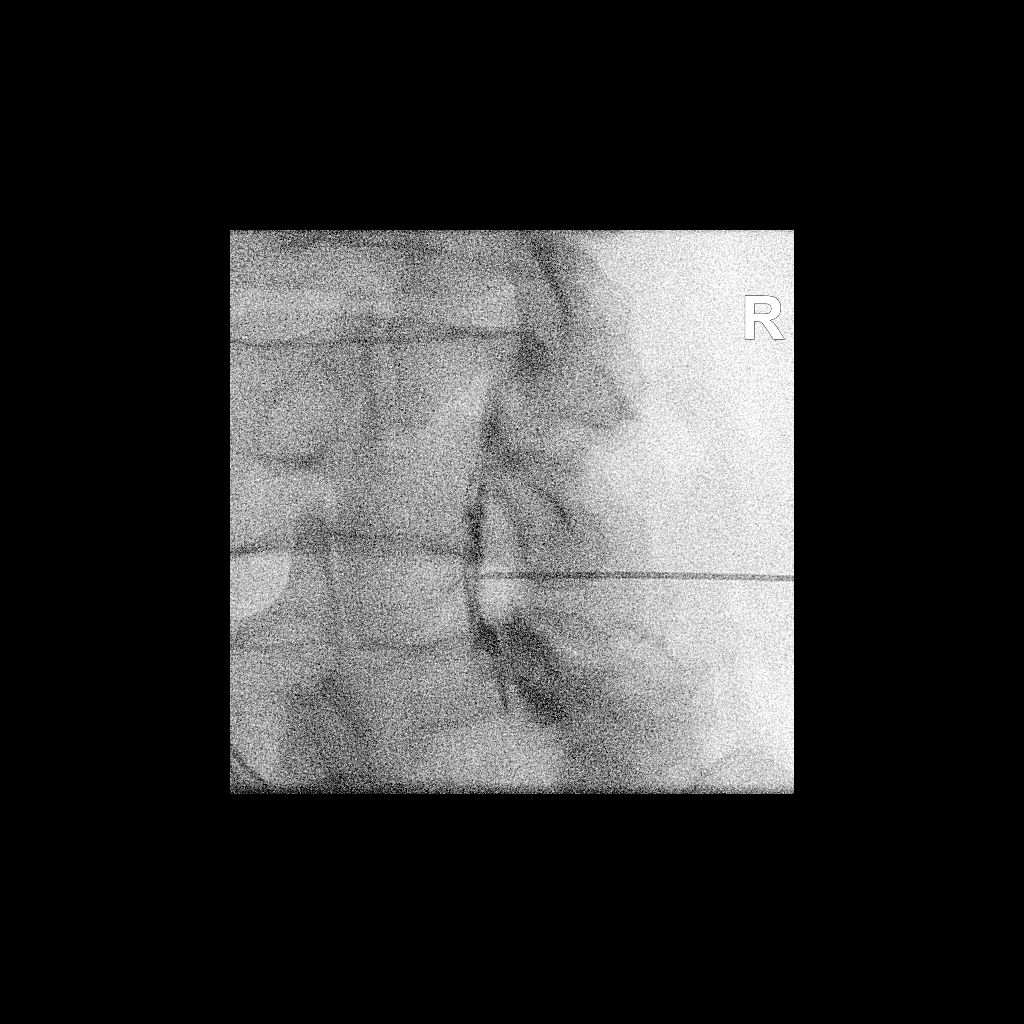

[Series 2: ortho standard · 1 of 1 slices shown (2 of 2)]
[im 1/1]
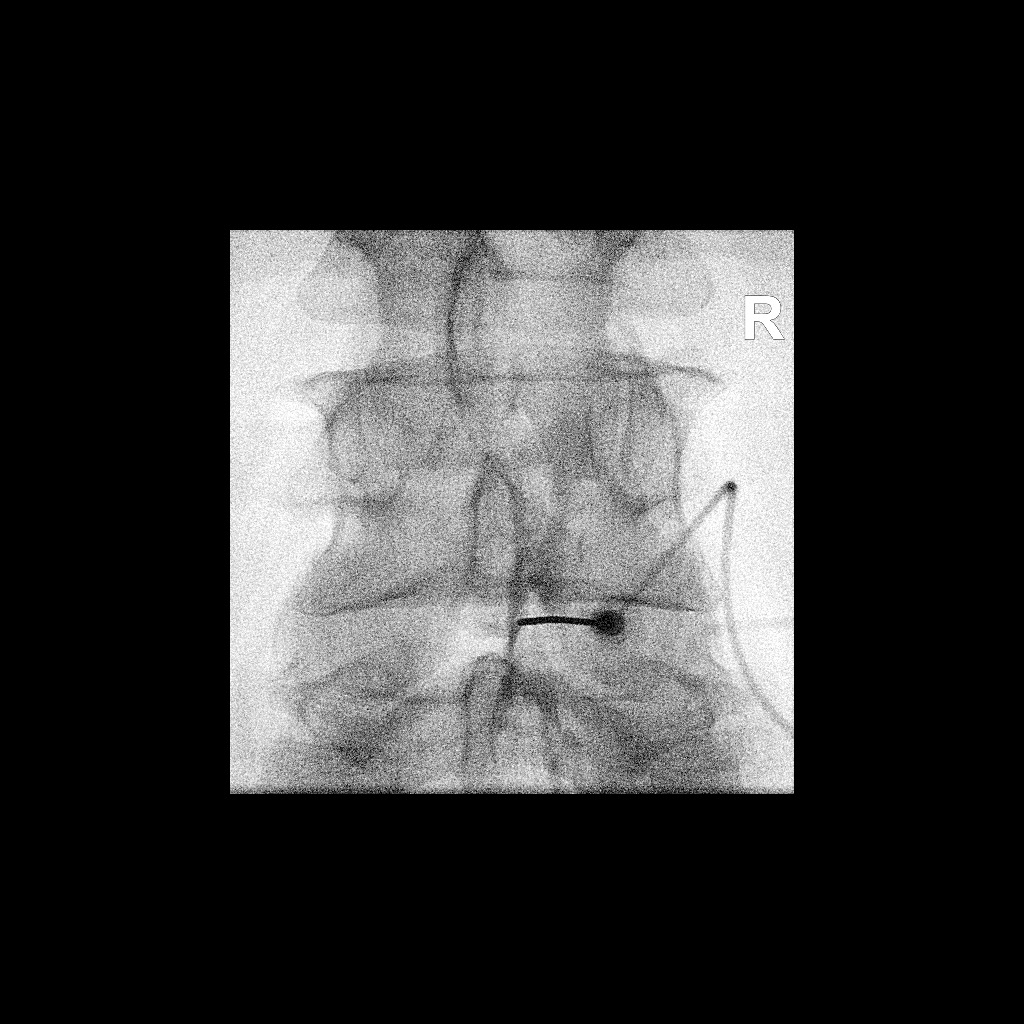

[2 of 2 positions shown; findings below may reference images not displayed]

FLUOROSCOPY:
Radiation Exposure Index (as provided by the fluoroscopic device):
1.20 mGy Kerma

PROCEDURE:
The procedure, risks, benefits, and alternatives were explained to
the patient. Questions regarding the procedure were encouraged and
answered. The patient understands and consents to the procedure.

LUMBAR EPIDURAL INJECTION:

An interlaminar approach was performed on the right at L4-5. The
overlying skin was cleansed and anesthetized. A 3.5 inch 20 gauge
epidural needle was advanced using loss-of-resistance technique.

DIAGNOSTIC EPIDURAL INJECTION:

Injection of Isovue-M 200 shows a good epidural pattern with spread
above and below the level of needle placement, primarily on the
right. No vascular opacification is seen.

THERAPEUTIC EPIDURAL INJECTION:

80 mg of Depo-Medrol mixed with 3 mL of 1% lidocaine were instilled.
The procedure was well-tolerated, and the patient was discharged
thirty minutes following the injection in good condition.

COMPLICATIONS:
None immediate
IMPRESSION: Technically successful interlaminar epidural injection on the right
at L4-5.

## 2021-11-15 MED ORDER — IOPAMIDOL (ISOVUE-M 200) INJECTION 41%
1.0000 mL | Freq: Once | INTRAMUSCULAR | Status: AC
  Administered 2021-11-15: 1 mL via EPIDURAL

## 2021-11-15 MED ORDER — METHYLPREDNISOLONE ACETATE 40 MG/ML INJ SUSP (RADIOLOG
80.0000 mg | Freq: Once | INTRAMUSCULAR | Status: AC
  Administered 2021-11-15: 80 mg via EPIDURAL

## 2021-11-15 NOTE — Discharge Instructions (Signed)

## 2021-11-30 DIAGNOSIS — R5383 Other fatigue: Secondary | ICD-10-CM | POA: Diagnosis not present

## 2021-11-30 DIAGNOSIS — M4690 Unspecified inflammatory spondylopathy, site unspecified: Secondary | ICD-10-CM | POA: Diagnosis not present

## 2021-11-30 DIAGNOSIS — M797 Fibromyalgia: Secondary | ICD-10-CM | POA: Diagnosis not present

## 2021-11-30 DIAGNOSIS — R7989 Other specified abnormal findings of blood chemistry: Secondary | ICD-10-CM | POA: Diagnosis not present

## 2022-03-02 DIAGNOSIS — R5383 Other fatigue: Secondary | ICD-10-CM | POA: Diagnosis not present

## 2022-03-02 DIAGNOSIS — M4690 Unspecified inflammatory spondylopathy, site unspecified: Secondary | ICD-10-CM | POA: Diagnosis not present

## 2022-03-02 DIAGNOSIS — R7989 Other specified abnormal findings of blood chemistry: Secondary | ICD-10-CM | POA: Diagnosis not present

## 2022-03-02 DIAGNOSIS — M797 Fibromyalgia: Secondary | ICD-10-CM | POA: Diagnosis not present

## 2022-05-22 ENCOUNTER — Other Ambulatory Visit: Payer: Self-pay

## 2022-05-22 ENCOUNTER — Encounter: Payer: Self-pay | Admitting: Family Medicine

## 2022-05-22 DIAGNOSIS — M5416 Radiculopathy, lumbar region: Secondary | ICD-10-CM

## 2022-05-25 ENCOUNTER — Ambulatory Visit
Admission: RE | Admit: 2022-05-25 | Discharge: 2022-05-25 | Disposition: A | Discharge status: Home / Self Care | Payer: BC Managed Care – PPO | Source: Ambulatory Visit | Attending: Family Medicine | Admitting: Family Medicine

## 2022-05-25 DIAGNOSIS — M5416 Radiculopathy, lumbar region: Secondary | ICD-10-CM

## 2022-05-25 DIAGNOSIS — M4727 Other spondylosis with radiculopathy, lumbosacral region: Secondary | ICD-10-CM | POA: Diagnosis not present

## 2022-05-25 MED ORDER — METHYLPREDNISOLONE ACETATE 40 MG/ML INJ SUSP (RADIOLOG
80.0000 mg | Freq: Once | INTRAMUSCULAR | Status: AC
  Administered 2022-05-25: 80 mg via EPIDURAL

## 2022-05-25 MED ORDER — IOPAMIDOL (ISOVUE-M 200) INJECTION 41%
1.0000 mL | Freq: Once | INTRAMUSCULAR | Status: AC
  Administered 2022-05-25: 1 mL via EPIDURAL

## 2022-05-25 NOTE — Discharge Instructions (Signed)

## 2022-05-31 DIAGNOSIS — R7301 Impaired fasting glucose: Secondary | ICD-10-CM | POA: Diagnosis not present

## 2022-05-31 DIAGNOSIS — I10 Essential (primary) hypertension: Secondary | ICD-10-CM | POA: Diagnosis not present

## 2022-06-01 DIAGNOSIS — K760 Fatty (change of) liver, not elsewhere classified: Secondary | ICD-10-CM | POA: Diagnosis not present

## 2022-06-01 DIAGNOSIS — G629 Polyneuropathy, unspecified: Secondary | ICD-10-CM | POA: Diagnosis not present

## 2022-06-01 DIAGNOSIS — D649 Anemia, unspecified: Secondary | ICD-10-CM | POA: Diagnosis not present

## 2022-06-01 DIAGNOSIS — R7401 Elevation of levels of liver transaminase levels: Secondary | ICD-10-CM | POA: Diagnosis not present

## 2022-06-07 DIAGNOSIS — Z1331 Encounter for screening for depression: Secondary | ICD-10-CM | POA: Diagnosis not present

## 2022-06-07 DIAGNOSIS — Z1339 Encounter for screening examination for other mental health and behavioral disorders: Secondary | ICD-10-CM | POA: Diagnosis not present

## 2022-06-07 DIAGNOSIS — R82998 Other abnormal findings in urine: Secondary | ICD-10-CM | POA: Diagnosis not present

## 2022-06-07 DIAGNOSIS — Z23 Encounter for immunization: Secondary | ICD-10-CM | POA: Diagnosis not present

## 2022-06-07 DIAGNOSIS — Z0000 Encounter for general adult medical examination without abnormal findings: Secondary | ICD-10-CM | POA: Diagnosis not present

## 2022-06-07 DIAGNOSIS — I10 Essential (primary) hypertension: Secondary | ICD-10-CM | POA: Diagnosis not present

## 2022-06-07 DIAGNOSIS — D649 Anemia, unspecified: Secondary | ICD-10-CM | POA: Diagnosis not present

## 2022-08-01 DIAGNOSIS — E781 Pure hyperglyceridemia: Secondary | ICD-10-CM | POA: Diagnosis not present

## 2022-09-04 DIAGNOSIS — Z1231 Encounter for screening mammogram for malignant neoplasm of breast: Secondary | ICD-10-CM | POA: Diagnosis not present

## 2022-09-05 DIAGNOSIS — M7989 Other specified soft tissue disorders: Secondary | ICD-10-CM | POA: Diagnosis not present

## 2022-09-05 DIAGNOSIS — R5383 Other fatigue: Secondary | ICD-10-CM | POA: Diagnosis not present

## 2022-09-05 DIAGNOSIS — M450 Ankylosing spondylitis of multiple sites in spine: Secondary | ICD-10-CM | POA: Diagnosis not present

## 2022-09-05 DIAGNOSIS — M797 Fibromyalgia: Secondary | ICD-10-CM | POA: Diagnosis not present

## 2022-09-05 DIAGNOSIS — R7989 Other specified abnormal findings of blood chemistry: Secondary | ICD-10-CM | POA: Diagnosis not present

## 2022-09-05 DIAGNOSIS — M064 Inflammatory polyarthropathy: Secondary | ICD-10-CM | POA: Diagnosis not present

## 2022-09-08 DIAGNOSIS — R928 Other abnormal and inconclusive findings on diagnostic imaging of breast: Secondary | ICD-10-CM | POA: Diagnosis not present

## 2022-09-11 ENCOUNTER — Other Ambulatory Visit: Payer: Self-pay | Admitting: Radiology

## 2022-09-11 DIAGNOSIS — N6011 Diffuse cystic mastopathy of right breast: Secondary | ICD-10-CM | POA: Diagnosis not present

## 2022-09-11 DIAGNOSIS — R928 Other abnormal and inconclusive findings on diagnostic imaging of breast: Secondary | ICD-10-CM | POA: Diagnosis not present

## 2022-09-19 DIAGNOSIS — Z124 Encounter for screening for malignant neoplasm of cervix: Secondary | ICD-10-CM | POA: Diagnosis not present

## 2022-09-19 DIAGNOSIS — Z6831 Body mass index (BMI) 31.0-31.9, adult: Secondary | ICD-10-CM | POA: Diagnosis not present

## 2022-09-19 DIAGNOSIS — Z01419 Encounter for gynecological examination (general) (routine) without abnormal findings: Secondary | ICD-10-CM | POA: Diagnosis not present

## 2022-09-29 ENCOUNTER — Other Ambulatory Visit: Payer: Self-pay | Admitting: General Surgery

## 2022-09-29 DIAGNOSIS — N6489 Other specified disorders of breast: Secondary | ICD-10-CM | POA: Diagnosis not present

## 2022-10-09 ENCOUNTER — Other Ambulatory Visit: Payer: Self-pay

## 2022-10-09 ENCOUNTER — Encounter: Payer: Self-pay | Admitting: Family Medicine

## 2022-10-09 DIAGNOSIS — M5416 Radiculopathy, lumbar region: Secondary | ICD-10-CM

## 2022-10-12 ENCOUNTER — Ambulatory Visit
Admission: RE | Admit: 2022-10-12 | Discharge: 2022-10-12 | Disposition: A | Discharge status: Home / Self Care | Payer: BC Managed Care – PPO | Source: Ambulatory Visit | Attending: Family Medicine | Admitting: Family Medicine

## 2022-10-12 DIAGNOSIS — M5432 Sciatica, left side: Secondary | ICD-10-CM | POA: Diagnosis not present

## 2022-10-12 DIAGNOSIS — M47817 Spondylosis without myelopathy or radiculopathy, lumbosacral region: Secondary | ICD-10-CM | POA: Diagnosis not present

## 2022-10-12 DIAGNOSIS — M5416 Radiculopathy, lumbar region: Secondary | ICD-10-CM

## 2022-10-12 MED ORDER — METHYLPREDNISOLONE ACETATE 40 MG/ML INJ SUSP (RADIOLOG
80.0000 mg | Freq: Once | INTRAMUSCULAR | Status: AC
  Administered 2022-10-12: 80 mg via EPIDURAL

## 2022-10-12 MED ORDER — IOPAMIDOL (ISOVUE-M 200) INJECTION 41%
1.0000 mL | Freq: Once | INTRAMUSCULAR | Status: AC
  Administered 2022-10-12: 1 mL via EPIDURAL

## 2022-10-12 NOTE — Discharge Instructions (Signed)

## 2022-10-13 ENCOUNTER — Other Ambulatory Visit: Payer: Self-pay

## 2022-10-13 ENCOUNTER — Encounter (HOSPITAL_BASED_OUTPATIENT_CLINIC_OR_DEPARTMENT_OTHER): Payer: Self-pay | Admitting: General Surgery

## 2022-10-16 ENCOUNTER — Encounter (HOSPITAL_BASED_OUTPATIENT_CLINIC_OR_DEPARTMENT_OTHER)
Admission: RE | Admit: 2022-10-16 | Discharge: 2022-10-16 | Disposition: A | Discharge status: Home / Self Care | Payer: BC Managed Care – PPO | Source: Ambulatory Visit | Attending: General Surgery | Admitting: General Surgery

## 2022-10-16 DIAGNOSIS — Z01810 Encounter for preprocedural cardiovascular examination: Secondary | ICD-10-CM | POA: Insufficient documentation

## 2022-10-16 MED ORDER — ENSURE PRE-SURGERY PO LIQD: 296.0000 mL | Freq: Once | ORAL | Status: DC

## 2022-10-16 MED ORDER — CHLORHEXIDINE GLUCONATE CLOTH 2 % EX PADS: 6.0000 | MEDICATED_PAD | Freq: Once | CUTANEOUS | Status: DC

## 2022-10-16 NOTE — Progress Notes (Signed)

## 2022-10-20 DIAGNOSIS — N6489 Other specified disorders of breast: Secondary | ICD-10-CM | POA: Diagnosis not present

## 2022-10-20 DIAGNOSIS — R928 Other abnormal and inconclusive findings on diagnostic imaging of breast: Secondary | ICD-10-CM | POA: Diagnosis not present

## 2022-10-23 ENCOUNTER — Ambulatory Visit (HOSPITAL_BASED_OUTPATIENT_CLINIC_OR_DEPARTMENT_OTHER)
Admission: RE | Admit: 2022-10-23 | Discharge: 2022-10-23 | Disposition: A | Discharge status: Home / Self Care | Payer: BC Managed Care – PPO | Attending: General Surgery | Admitting: General Surgery

## 2022-10-23 ENCOUNTER — Ambulatory Visit (HOSPITAL_BASED_OUTPATIENT_CLINIC_OR_DEPARTMENT_OTHER): Payer: BC Managed Care – PPO | Admitting: Anesthesiology

## 2022-10-23 ENCOUNTER — Encounter (HOSPITAL_BASED_OUTPATIENT_CLINIC_OR_DEPARTMENT_OTHER)
Admission: RE | Disposition: A | Discharge status: Home / Self Care | Payer: Self-pay | Source: Home / Self Care | Attending: General Surgery

## 2022-10-23 ENCOUNTER — Other Ambulatory Visit: Payer: Self-pay

## 2022-10-23 ENCOUNTER — Encounter (HOSPITAL_BASED_OUTPATIENT_CLINIC_OR_DEPARTMENT_OTHER): Payer: Self-pay | Admitting: General Surgery

## 2022-10-23 DIAGNOSIS — Z01818 Encounter for other preprocedural examination: Secondary | ICD-10-CM

## 2022-10-23 DIAGNOSIS — N6314 Unspecified lump in the right breast, lower inner quadrant: Secondary | ICD-10-CM | POA: Diagnosis not present

## 2022-10-23 DIAGNOSIS — Z87891 Personal history of nicotine dependence: Secondary | ICD-10-CM | POA: Diagnosis not present

## 2022-10-23 DIAGNOSIS — N6081 Other benign mammary dysplasias of right breast: Secondary | ICD-10-CM | POA: Diagnosis not present

## 2022-10-23 DIAGNOSIS — N62 Hypertrophy of breast: Secondary | ICD-10-CM | POA: Diagnosis not present

## 2022-10-23 DIAGNOSIS — N6021 Fibroadenosis of right breast: Secondary | ICD-10-CM | POA: Diagnosis not present

## 2022-10-23 DIAGNOSIS — I10 Essential (primary) hypertension: Secondary | ICD-10-CM | POA: Diagnosis not present

## 2022-10-23 DIAGNOSIS — N6011 Diffuse cystic mastopathy of right breast: Secondary | ICD-10-CM | POA: Diagnosis not present

## 2022-10-23 DIAGNOSIS — N6489 Other specified disorders of breast: Secondary | ICD-10-CM | POA: Insufficient documentation

## 2022-10-23 DIAGNOSIS — F1120 Opioid dependence, uncomplicated: Secondary | ICD-10-CM | POA: Insufficient documentation

## 2022-10-23 DIAGNOSIS — M797 Fibromyalgia: Secondary | ICD-10-CM | POA: Diagnosis not present

## 2022-10-23 DIAGNOSIS — M1990 Unspecified osteoarthritis, unspecified site: Secondary | ICD-10-CM | POA: Insufficient documentation

## 2022-10-23 HISTORY — PX: RADIOACTIVE SEED GUIDED EXCISIONAL BREAST BIOPSY: SHX6490

## 2022-10-23 SURGERY — RADIOACTIVE SEED GUIDED BREAST BIOPSY
Anesthesia: General | Site: Breast | Laterality: Right

## 2022-10-23 MED ORDER — ONDANSETRON HCL 4 MG/2ML IJ SOLN
INTRAMUSCULAR | Status: DC | PRN
  Administered 2022-10-23: 4 mg via INTRAVENOUS

## 2022-10-23 MED ORDER — BUPIVACAINE HCL (PF) 0.25 % IJ SOLN
INTRAMUSCULAR | Status: AC
  Filled 2022-10-23: qty 120

## 2022-10-23 MED ORDER — LACTATED RINGERS IV SOLN: INTRAVENOUS | Status: DC

## 2022-10-23 MED ORDER — PROPOFOL 10 MG/ML IV BOLUS
INTRAVENOUS | Status: AC
  Filled 2022-10-23: qty 20

## 2022-10-23 MED ORDER — LIDOCAINE HCL 1 % IJ SOLN: INTRAMUSCULAR | Status: DC | PRN

## 2022-10-23 MED ORDER — ONDANSETRON HCL 4 MG/2ML IJ SOLN
INTRAMUSCULAR | Status: AC
  Filled 2022-10-23: qty 2

## 2022-10-23 MED ORDER — ACETAMINOPHEN 500 MG PO TABS
ORAL_TABLET | ORAL | Status: AC
  Filled 2022-10-23: qty 2

## 2022-10-23 MED ORDER — BUPIVACAINE HCL (PF) 0.5 % IJ SOLN
INTRAMUSCULAR | Status: AC
  Filled 2022-10-23: qty 30

## 2022-10-23 MED ORDER — ACETAMINOPHEN 500 MG PO TABS
1000.0000 mg | ORAL_TABLET | ORAL | Status: AC
  Administered 2022-10-23: 1000 mg via ORAL

## 2022-10-23 MED ORDER — HYDROMORPHONE HCL 1 MG/ML IJ SOLN: 0.2500 mg | INTRAMUSCULAR | Status: DC | PRN

## 2022-10-23 MED ORDER — CEFAZOLIN SODIUM-DEXTROSE 2-4 GM/100ML-% IV SOLN: 2.0000 g | INTRAVENOUS | Status: DC

## 2022-10-23 MED ORDER — AMISULPRIDE (ANTIEMETIC) 5 MG/2ML IV SOLN: 10.0000 mg | Freq: Once | INTRAVENOUS | Status: DC | PRN

## 2022-10-23 MED ORDER — FENTANYL CITRATE (PF) 100 MCG/2ML IJ SOLN
INTRAMUSCULAR | Status: AC
  Filled 2022-10-23: qty 2

## 2022-10-23 MED ORDER — CEFAZOLIN SODIUM-DEXTROSE 2-4 GM/100ML-% IV SOLN
INTRAVENOUS | Status: AC
  Filled 2022-10-23: qty 100

## 2022-10-23 MED ORDER — DEXAMETHASONE SODIUM PHOSPHATE 10 MG/ML IJ SOLN
INTRAMUSCULAR | Status: DC | PRN
  Administered 2022-10-23: 10 mg via INTRAVENOUS

## 2022-10-23 MED ORDER — LIDOCAINE HCL (CARDIAC) PF 100 MG/5ML IV SOSY
PREFILLED_SYRINGE | INTRAVENOUS | Status: DC | PRN
  Administered 2022-10-23: 60 mg via INTRATRACHEAL

## 2022-10-23 MED ORDER — PROPOFOL 10 MG/ML IV BOLUS
INTRAVENOUS | Status: DC | PRN
  Administered 2022-10-23: 150 mg via INTRAVENOUS
  Administered 2022-10-23: 30 mg via INTRAVENOUS

## 2022-10-23 MED ORDER — MIDAZOLAM HCL 2 MG/2ML IJ SOLN
INTRAMUSCULAR | Status: AC
  Filled 2022-10-23: qty 2

## 2022-10-23 MED ORDER — OXYCODONE HCL 5 MG PO TABS
ORAL_TABLET | ORAL | Status: AC
  Filled 2022-10-23: qty 1

## 2022-10-23 MED ORDER — BUPIVACAINE HCL (PF) 0.25 % IJ SOLN
INTRAMUSCULAR | Status: DC | PRN
  Administered 2022-10-23: 10 mL

## 2022-10-23 MED ORDER — ONDANSETRON HCL 4 MG/2ML IJ SOLN: 4.0000 mg | Freq: Once | INTRAMUSCULAR | Status: DC | PRN

## 2022-10-23 MED ORDER — PROPOFOL 500 MG/50ML IV EMUL
INTRAVENOUS | Status: DC | PRN
  Administered 2022-10-23: 150 ug/kg/min via INTRAVENOUS

## 2022-10-23 MED ORDER — OXYCODONE HCL 5 MG/5ML PO SOLN: 5.0000 mg | Freq: Once | ORAL | Status: AC | PRN

## 2022-10-23 MED ORDER — FENTANYL CITRATE (PF) 100 MCG/2ML IJ SOLN
INTRAMUSCULAR | Status: DC | PRN
  Administered 2022-10-23 (×2): 25 ug via INTRAVENOUS
  Administered 2022-10-23: 50 ug via INTRAVENOUS

## 2022-10-23 MED ORDER — MIDAZOLAM HCL 5 MG/5ML IJ SOLN
INTRAMUSCULAR | Status: DC | PRN
  Administered 2022-10-23: 2 mg via INTRAVENOUS

## 2022-10-23 MED ORDER — OXYCODONE HCL 5 MG PO TABS
5.0000 mg | ORAL_TABLET | Freq: Once | ORAL | Status: AC | PRN
  Administered 2022-10-23: 5 mg via ORAL

## 2022-10-23 SURGICAL SUPPLY — 48 items
ADH SKN CLS APL DERMABOND .7 (GAUZE/BANDAGES/DRESSINGS) ×1
APL PRP STRL LF DISP 70% ISPRP (MISCELLANEOUS) ×1
APPLIER CLIP 9.375 MED OPEN (MISCELLANEOUS)
APR CLP MED 9.3 20 MLT OPN (MISCELLANEOUS)
BINDER BREAST XLRG (GAUZE/BANDAGES/DRESSINGS) IMPLANT
BLADE SURG 15 STRL LF DISP TIS (BLADE) ×2 IMPLANT
BLADE SURG 15 STRL SS (BLADE) ×1
CANISTER SUC SOCK COL 7IN (MISCELLANEOUS) IMPLANT
CANISTER SUCT 1200ML W/VALVE (MISCELLANEOUS) IMPLANT
CHLORAPREP W/TINT 26 (MISCELLANEOUS) ×2 IMPLANT
CLIP APPLIE 9.375 MED OPEN (MISCELLANEOUS) IMPLANT
COVER BACK TABLE 60X90IN (DRAPES) ×2 IMPLANT
COVER MAYO STAND STRL (DRAPES) ×2 IMPLANT
COVER PROBE CYLINDRICAL 5X96 (MISCELLANEOUS) ×2 IMPLANT
DERMABOND ADVANCED .7 DNX12 (GAUZE/BANDAGES/DRESSINGS) ×2 IMPLANT
DRAPE LAPAROSCOPIC ABDOMINAL (DRAPES) ×2 IMPLANT
DRAPE UTILITY XL STRL (DRAPES) ×2 IMPLANT
ELECT COATED BLADE 2.86 ST (ELECTRODE) ×2 IMPLANT
ELECT REM PT RETURN 9FT ADLT (ELECTROSURGICAL) ×1
ELECTRODE REM PT RTRN 9FT ADLT (ELECTROSURGICAL) ×2 IMPLANT
GLOVE BIO SURGEON STRL SZ7 (GLOVE) ×4 IMPLANT
GLOVE BIOGEL PI IND STRL 7.5 (GLOVE) ×2 IMPLANT
GOWN STRL REUS W/ TWL LRG LVL3 (GOWN DISPOSABLE) ×4 IMPLANT
GOWN STRL REUS W/TWL LRG LVL3 (GOWN DISPOSABLE) ×2
HEMOSTAT ARISTA ABSORB 3G PWDR (HEMOSTASIS) IMPLANT
KIT MARKER MARGIN INK (KITS) ×2 IMPLANT
NDL HYPO 25X1 1.5 SAFETY (NEEDLE) ×2 IMPLANT
NEEDLE HYPO 25X1 1.5 SAFETY (NEEDLE) ×1 IMPLANT
NS IRRIG 1000ML POUR BTL (IV SOLUTION) IMPLANT
PACK BASIN DAY SURGERY FS (CUSTOM PROCEDURE TRAY) ×2 IMPLANT
PENCIL SMOKE EVACUATOR (MISCELLANEOUS) ×2 IMPLANT
RETRACTOR ONETRAX LX 90X20 (MISCELLANEOUS) IMPLANT
SLEEVE SCD COMPRESS KNEE MED (STOCKING) ×2 IMPLANT
SPIKE FLUID TRANSFER (MISCELLANEOUS) IMPLANT
SPONGE T-LAP 4X18 ~~LOC~~+RFID (SPONGE) ×2 IMPLANT
STRIP CLOSURE SKIN 1/2X4 (GAUZE/BANDAGES/DRESSINGS) ×2 IMPLANT
SUT MNCRL AB 4-0 PS2 18 (SUTURE) IMPLANT
SUT MON AB 5-0 PS2 18 (SUTURE) IMPLANT
SUT SILK 2 0 SH (SUTURE) IMPLANT
SUT VIC AB 2-0 SH 27 (SUTURE) ×1
SUT VIC AB 2-0 SH 27XBRD (SUTURE) ×2 IMPLANT
SUT VIC AB 3-0 SH 27 (SUTURE) ×1
SUT VIC AB 3-0 SH 27X BRD (SUTURE) ×2 IMPLANT
SYR CONTROL 10ML LL (SYRINGE) ×2 IMPLANT
TOWEL GREEN STERILE FF (TOWEL DISPOSABLE) ×2 IMPLANT
TRAY FAXITRON CT DISP (TRAY / TRAY PROCEDURE) ×2 IMPLANT
TUBE CONNECTING 20X1/4 (TUBING) IMPLANT
YANKAUER SUCT BULB TIP NO VENT (SUCTIONS) IMPLANT

## 2022-10-23 NOTE — Anesthesia Preprocedure Evaluation (Addendum)
Anesthesia Evaluation  Patient identified by MRN, date of birth, ID band Patient awake    Reviewed: Allergy & Precautions, NPO status , Patient's Chart, lab work & pertinent test results  Airway Mallampati: II  TM Distance: >3 FB Neck ROM: Full    Dental  (+) Teeth Intact, Dental Advisory Given   Pulmonary former smoker Quit smoking 2002, 10 pack year history    Pulmonary exam normal breath sounds clear to auscultation       Cardiovascular hypertension (136/65 preop), Pt. on medications Normal cardiovascular exam Rhythm:Regular Rate:Normal     Neuro/Psych  Headaches PSYCHIATRIC DISORDERS  Depression       GI/Hepatic Neg liver ROS,GERD  Controlled,,  Endo/Other  negative endocrine ROS    Renal/GU negative Renal ROS     Musculoskeletal  (+) Arthritis , Osteoarthritis,  Fibromyalgia - (norco 5/325 only takes occasionally for back pain), narcotic dependent  Abdominal  (+) + obese  Peds  Hematology negative hematology ROS (+)   Anesthesia Other Findings   Reproductive/Obstetrics                             Anesthesia Physical Anesthesia Plan  ASA: 2  Anesthesia Plan: General   Post-op Pain Management: Tylenol PO (pre-op)*   Induction: Intravenous  PONV Risk Score and Plan: 3 and Ondansetron, Dexamethasone, Midazolam, Treatment may vary due to age or medical condition, Propofol infusion and TIVA  Airway Management Planned: LMA  Additional Equipment: None  Intra-op Plan:   Post-operative Plan: Extubation in OR  Informed Consent: I have reviewed the patients History and Physical, chart, labs and discussed the procedure including the risks, benefits and alternatives for the proposed anesthesia with the patient or authorized representative who has indicated his/her understanding and acceptance.     Dental advisory given  Plan Discussed with: CRNA  Anesthesia Plan Comments:          Anesthesia Quick Evaluation

## 2022-10-23 NOTE — Discharge Instructions (Addendum)
[facility] Office Phone Number [phone]  POST OP INSTRUCTIONS Take 400 mg of ibuprofen every 8 hours or 650 mg tylenol every 6 hours for next 72 hours then as needed. Use ice several times daily also.  A prescription for pain medication may be given to you upon discharge.  Take your pain medication as prescribed, if needed.  If narcotic pain medicine is not needed, then you may take acetaminophen (Tylenol), naprosyn (Alleve) or ibuprofen (Advil) as needed. Take your usually prescribed medications unless otherwise directed If you need a refill on your pain medication, please contact your pharmacy.  They will contact our office to request authorization.  Prescriptions will not be filled after 5pm or on week-ends. You should eat very light the first 24 hours after surgery, such as soup, crackers, pudding, etc.  Resume your normal diet the day after surgery. Most patients will experience some swelling and bruising in the breast.  Ice packs and a good support bra will help.  Wear the breast binder provided or a sports bra for 72 hours day and night.  After that wear a sports bra during the day until you return to the office. Swelling and bruising can take several days to resolve.  It is common to experience some constipation if taking pain medication after surgery.  Increasing fluid intake and taking a stool softener will usually help or prevent this problem from occurring.  A mild laxative (Milk of Magnesia or Miralax) should be taken according to package directions if there are no bowel movements after 48 hours. I used skin glue on the incision, you may shower in 24 hours.  The glue will flake off over the next 2-3 weeks.  Any sutures or staples will be removed at the office during your follow-up visit. ACTIVITIES:  You may resume regular daily activities (gradually increasing) beginning the next day.  Wearing a good support bra or sports bra minimizes pain and swelling.  You may have  sexual intercourse when it is comfortable. You may drive when you no longer are taking prescription pain medication, you can comfortably wear a seatbelt, and you can safely maneuver your car and apply brakes. RETURN TO WORK:  ______________________________________________________________________________________ Tonya Elliott should see your doctor in the office for a follow-up appointment approximately two weeks after your surgery.  Your doctor's nurse will typically make your follow-up appointment when she calls you with your pathology report.  Expect your pathology report 3-4 business days after your surgery.  You may call to check if you do not hear from [place] after three days. OTHER INSTRUCTIONS: _______________________________________________________________________________________________ _____________________________________________________________________________________________________________________________________ _____________________________________________________________________________________________________________________________________ _____________________________________________________________________________________________________________________________________  WHEN TO CALL DR WAKEFIELD: Fever over 101.0 Nausea and/or vomiting. Extreme swelling or bruising. Continued bleeding from incision. Increased pain, redness, or drainage from the incision.  The clinic staff is available to answer your questions during regular business hours.  Please don't hesitate to call and ask to speak to one of the nurses for clinical concerns.  If you have a medical emergency, go to the nearest emergency room or call 911.  A surgeon from [facility] is always on call at the hospital.  For further questions, please visit [email or web address]     Post Anesthesia Home Care Instructions  Activity: Get plenty of rest for the remainder of the day. A responsible individual must stay  with you for 24 hours following the procedure.  For the next 24 hours, DO NOT: -Drive a car -Advertising copywriter -Drink alcoholic beverages -Take any medication unless  instructed by your physician -Make any legal decisions or sign important papers.  Meals: Start with liquid foods such as gelatin or soup. Progress to regular foods as tolerated. Avoid greasy, spicy, heavy foods. If nausea and/or vomiting occur, drink only clear liquids until the nausea and/or vomiting subsides. Call your physician if vomiting continues.  Special Instructions/Symptoms: Your throat may feel dry or sore from the anesthesia or the breathing tube placed in your throat during surgery. If this causes discomfort, gargle with warm salt water. The discomfort should disappear within 24 hours.  If you had a scopolamine patch placed behind your ear for the management of post- operative nausea and/or vomiting:  1. The medication in the patch is effective for 72 hours, after which it should be removed.  Wrap patch in a tissue and discard in the trash. Wash hands thoroughly with soap and water. 2. You may remove the patch earlier than 72 hours if you experience unpleasant side effects which may include dry mouth, dizziness or visual disturbances. 3. Avoid touching the patch. Wash your hands with soap and water after contact with the patch.      Next dose of Tylenol may be given at 2:30pm if needed.

## 2022-10-23 NOTE — Anesthesia Procedure Notes (Signed)
Procedure Name: LMA Insertion Date/Time: 10/23/2022 8:56 AM  Performed by: Karen Kitchens, CRNAPre-anesthesia Checklist: Patient identified, Emergency Drugs available, Suction available and Patient being monitored Patient Re-evaluated:Patient Re-evaluated prior to induction Oxygen Delivery Method: Circle system utilized Preoxygenation: Pre-oxygenation with 100% oxygen Induction Type: IV induction Ventilation: Mask ventilation without difficulty LMA: LMA inserted LMA Size: 4.0 Number of attempts: 1 Airway Equipment and Method: Bite block Placement Confirmation: positive ETCO2, CO2 detector and breath sounds checked- equal and bilateral Tube secured with: Tape Dental Injury: Teeth and Oropharynx as per pre-operative assessment

## 2022-10-23 NOTE — Anesthesia Postprocedure Evaluation (Signed)
Anesthesia Post Note  Patient: Tonya Elliott  Procedure(s) Performed: RADIOACTIVE SEED GUIDED EXCISIONAL RIGHT BREAST BIOPSY (Right: Breast)     Patient location during evaluation: PACU Anesthesia Type: General Level of consciousness: awake and alert, oriented and patient cooperative Pain management: pain level controlled Vital Signs Assessment: post-procedure vital signs reviewed and stable Respiratory status: spontaneous breathing, nonlabored ventilation and respiratory function stable Cardiovascular status: blood pressure returned to baseline and stable Postop Assessment: no apparent nausea or vomiting Anesthetic complications: no   No notable events documented.  Last Vitals:  Vitals:   10/23/22 1000 10/23/22 1012  BP: (!) 141/82 (!) 154/83  Pulse: (!) 57 (!) 56  Resp: 14 16  Temp:  36.6 C  SpO2: 98% 98%    Last Pain:  Vitals:   10/23/22 1018  TempSrc:   PainSc: 4                  Lannie Fields

## 2022-10-23 NOTE — Transfer of Care (Signed)
Immediate Anesthesia Transfer of Care Note  Patient: PEYTAN FELGAR  Procedure(s) Performed: RADIOACTIVE SEED GUIDED EXCISIONAL RIGHT BREAST BIOPSY (Right: Breast)  Patient Location: PACU  Anesthesia Type:General  Level of Consciousness: drowsy and patient cooperative  Airway & Oxygen Therapy: Patient Spontanous Breathing and Patient connected to face mask oxygen  Post-op Assessment: Report given to RN and Post -op Vital signs reviewed and stable  Post vital signs: Reviewed and stable  Last Vitals:  Vitals Value Taken Time  BP    Temp    Pulse 59 10/23/22 0930  Resp 13 10/23/22 0930  SpO2 100 % 10/23/22 0930  Vitals shown include unvalidated device data.  Last Pain:  Vitals:   10/23/22 0814  TempSrc: Oral  PainSc: 0-No pain         Complications: No notable events documented.

## 2022-10-23 NOTE — Op Note (Signed)
  Preoperative diagnosis: Right breast mass with core biopsy c/w radial scar Postoperative diagnosis: Same as above Procedure: Right breast radioactive seed guided excisional biopsy Surgeon: Dr. Harden Mo Anesthesia: General Estimated blood loss: Minimal Complications: None Drains: None Specimens: Right breast tissue containing seed and clip marked with paint sent to pathology Sponge needle count was correct at completion Disposition to recovery stable condition   Indications: 61 yof who underwent screening mm with B density breast tissue. She had architectural distortion noted on mm. There was a 9x5x9 mm irregular lesion noted in the lower inner quadrant. Stereo biopsy shows a radial scar. She is here to discuss options She has no prior breast history except cyst aspiration. She has no fh of breast or ovarian cancer    Procedure: After informed consent was obtained she was taken to the operating room.  She was given antibiotics.  SCDs were placed.  She was placed under general anesthesia without complication.  She was prepped and draped in the standard sterile surgical fashion.  A surgical timeout was then performed.   The seed was in the lower inner quadrant.  I made an inferior periareolar incision in order to hide the scar later.  I infiltrated Marcaine throughout this area.  I then used the neoprobe to dissect to the seed.   I removed the seed and some of the surrounding tissue.  Mammogram confirmed removal of the seed and the clip.  I then obtained hemostasis.  I closed the breast tissue with 2-0 Vicryl.  The skin was closed with 3-0 Vicryl and 5-0 Monocryl.  Glue and Steri-Strips were applied.  She tolerated well was extubated transferred to recovery stable.

## 2022-10-23 NOTE — Interval H&P Note (Signed)
History and Physical Interval Note:  10/23/2022 8:28 AM  Tonya Elliott  has presented today for surgery, with the diagnosis of RIGHT BREAST MASS.  The various methods of treatment have been discussed with the patient and family. After consideration of risks, benefits and other options for treatment, the patient has consented to  Procedure(s): RADIOACTIVE SEED GUIDED EXCISIONAL RIGHT BREAST BIOPSY (Right) as a surgical intervention.  The patient's history has been reviewed, patient examined, no change in status, stable for surgery.  I have reviewed the patient's chart and labs.  Questions were answered to the patient's satisfaction.     Emelia Loron

## 2022-10-23 NOTE — H&P (Signed)
  67 yof who underwent screening mm with B density breast tissue. She had architectural distortion noted on mm. There was a 9x5x9 mm irregular lesion noted in the lower inner quadrant. Stereo biopsy shows a radial scar. She is here to discuss options She has no prior breast history except cyst aspiration. She has no fh of breast or ovarian cancer  Review of Systems: A complete review of systems was obtained from the patient. I have reviewed this information and discussed as appropriate with the patient. See HPI as well for other ROS.  Review of Systems  Endo/Heme/Allergies: Bruises/bleeds easily.  All other systems reviewed and are negative.  Medical History: No past medical history on file.  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   Not on File  No current outpatient medications on file prior to visit.   No family history on file.   Social History   Tobacco Use  Smoking Status Not on file  Smokeless Tobacco Not on file  Marital status: Married   Objective:  Physical Exam Vitals reviewed.  Constitutional:  Appearance: Normal appearance.  Chest:  Breasts: Right: No inverted nipple, mass or nipple discharge.  Left: No inverted nipple, mass or nipple discharge.  Lymphadenopathy:  Upper Body:  Right upper body: No supraclavicular or axillary adenopathy.  Left upper body: No supraclavicular or axillary adenopathy.  Neurological:  Mental Status: She is alert.    Assessment and Plan:   Radial scar of breast  Right breast seed guided excisional biopsy  We discussed upgrade rate of radial scar on mm and mri. This initially was quite high up to 30% but recently data would show this to be lower. With finding this on mm we discussed options. We discussed observation  with risk of missing something and following up with 6 month mm/us. We also discussed seed guided excision with risks, recovery as well. She would like to proceed with surgery.

## 2022-10-24 ENCOUNTER — Encounter (HOSPITAL_BASED_OUTPATIENT_CLINIC_OR_DEPARTMENT_OTHER): Payer: Self-pay | Admitting: General Surgery

## 2022-10-26 LAB — SURGICAL PATHOLOGY
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
## 2022-11-29 ENCOUNTER — Ambulatory Visit: Payer: BC Managed Care – PPO | Admitting: Family Medicine

## 2022-11-29 ENCOUNTER — Encounter: Payer: Self-pay | Admitting: Family Medicine

## 2022-11-29 VITALS — BP 126/80 | HR 94 | Ht 68.0 in | Wt 208.0 lb

## 2022-11-29 DIAGNOSIS — M47816 Spondylosis without myelopathy or radiculopathy, lumbar region: Secondary | ICD-10-CM | POA: Diagnosis not present

## 2022-11-29 DIAGNOSIS — M48062 Spinal stenosis, lumbar region with neurogenic claudication: Secondary | ICD-10-CM | POA: Diagnosis not present

## 2022-11-29 NOTE — Patient Instructions (Addendum)
Medial branch block: bilateral L4/L5 See if you candidate for radiofrequency ablation See me 2-3 months after injection

## 2022-11-29 NOTE — Assessment & Plan Note (Signed)
Patient unfortunately no longer is responding as well to this.  Seems to be having more pain with extension of the back and could be may be potentially having difficulty with more of the facet pain.  Patient reports wants to avoid any type of surgical intervention and wants to be more active but this is affecting quality of life as well as her being able to work out.  Is waking her up at night.  Discussed with patient to try medial branch block Patient does have the possibility that maybe then would not respond to radiofrequency ablation.  Will see how patient responds and will follow-up with me again in 6 to 8 weeks.

## 2022-12-21 NOTE — Discharge Instructions (Signed)
Medial Branch Block Discharge Instructions  Take over-the-counter and prescription medicines only as told by your health care provider.  Do not drive the day of your procedure  Return to your normal activities as told by your health care provider.   If injection site is sore you may ice the area for 20 minutes, 2-3 times a day.   Check your injection site every day for signs of infection. Check for: Redness, swelling, or pain. Fluid or blood. Warmth. Pus or a bad smell.  Please contact our office at [phone] if: You have a fever or chills. You have any signs of infection. You develop any numbness or weakness.   Thank you for visiting our office.  

## 2022-12-22 ENCOUNTER — Other Ambulatory Visit: Payer: Self-pay | Admitting: Family Medicine

## 2022-12-22 ENCOUNTER — Ambulatory Visit
Admission: RE | Admit: 2022-12-22 | Discharge: 2022-12-22 | Disposition: A | Discharge status: Home / Self Care | Payer: BC Managed Care – PPO | Source: Ambulatory Visit | Attending: Family Medicine | Admitting: Family Medicine

## 2022-12-22 DIAGNOSIS — M47816 Spondylosis without myelopathy or radiculopathy, lumbar region: Secondary | ICD-10-CM

## 2022-12-22 DIAGNOSIS — M545 Low back pain, unspecified: Secondary | ICD-10-CM | POA: Diagnosis not present

## 2022-12-22 DIAGNOSIS — M4696 Unspecified inflammatory spondylopathy, lumbar region: Secondary | ICD-10-CM | POA: Diagnosis not present

## 2023-01-02 ENCOUNTER — Other Ambulatory Visit: Payer: Self-pay

## 2023-01-02 ENCOUNTER — Encounter: Payer: Self-pay | Admitting: Family Medicine

## 2023-01-02 DIAGNOSIS — M5416 Radiculopathy, lumbar region: Secondary | ICD-10-CM

## 2023-01-24 ENCOUNTER — Other Ambulatory Visit: Payer: BC Managed Care – PPO

## 2023-01-24 NOTE — Discharge Instructions (Signed)

## 2023-01-25 ENCOUNTER — Ambulatory Visit
Admission: RE | Admit: 2023-01-25 | Discharge: 2023-01-25 | Disposition: A | Discharge status: Home / Self Care | Payer: BC Managed Care – PPO | Source: Ambulatory Visit | Attending: Family Medicine | Admitting: Family Medicine

## 2023-01-25 DIAGNOSIS — M4727 Other spondylosis with radiculopathy, lumbosacral region: Secondary | ICD-10-CM | POA: Diagnosis not present

## 2023-01-25 DIAGNOSIS — M5416 Radiculopathy, lumbar region: Secondary | ICD-10-CM

## 2023-01-25 MED ORDER — IOPAMIDOL (ISOVUE-M 200) INJECTION 41%
1.0000 mL | Freq: Once | INTRAMUSCULAR | Status: AC
  Administered 2023-01-25: 1 mL via EPIDURAL

## 2023-01-25 MED ORDER — METHYLPREDNISOLONE ACETATE 40 MG/ML INJ SUSP (RADIOLOG
80.0000 mg | Freq: Once | INTRAMUSCULAR | Status: AC
  Administered 2023-01-25: 80 mg via EPIDURAL

## 2023-03-07 DIAGNOSIS — R5383 Other fatigue: Secondary | ICD-10-CM | POA: Diagnosis not present

## 2023-03-07 DIAGNOSIS — M450 Ankylosing spondylitis of multiple sites in spine: Secondary | ICD-10-CM | POA: Diagnosis not present

## 2023-03-07 DIAGNOSIS — M797 Fibromyalgia: Secondary | ICD-10-CM | POA: Diagnosis not present

## 2023-03-07 DIAGNOSIS — R7989 Other specified abnormal findings of blood chemistry: Secondary | ICD-10-CM | POA: Diagnosis not present

## 2023-03-25 DIAGNOSIS — J189 Pneumonia, unspecified organism: Secondary | ICD-10-CM | POA: Diagnosis not present

## 2023-03-25 DIAGNOSIS — R062 Wheezing: Secondary | ICD-10-CM | POA: Diagnosis not present

## 2023-03-25 DIAGNOSIS — R051 Acute cough: Secondary | ICD-10-CM | POA: Diagnosis not present

## 2023-06-05 DIAGNOSIS — E782 Mixed hyperlipidemia: Secondary | ICD-10-CM | POA: Diagnosis not present

## 2023-06-07 DIAGNOSIS — E782 Mixed hyperlipidemia: Secondary | ICD-10-CM | POA: Diagnosis not present

## 2023-06-07 DIAGNOSIS — D649 Anemia, unspecified: Secondary | ICD-10-CM | POA: Diagnosis not present

## 2023-06-07 DIAGNOSIS — R7301 Impaired fasting glucose: Secondary | ICD-10-CM | POA: Diagnosis not present

## 2023-06-13 DIAGNOSIS — R82998 Other abnormal findings in urine: Secondary | ICD-10-CM | POA: Diagnosis not present

## 2023-06-13 DIAGNOSIS — Z1339 Encounter for screening examination for other mental health and behavioral disorders: Secondary | ICD-10-CM | POA: Diagnosis not present

## 2023-06-13 DIAGNOSIS — I129 Hypertensive chronic kidney disease with stage 1 through stage 4 chronic kidney disease, or unspecified chronic kidney disease: Secondary | ICD-10-CM | POA: Diagnosis not present

## 2023-06-13 DIAGNOSIS — Z0000 Encounter for general adult medical examination without abnormal findings: Secondary | ICD-10-CM | POA: Diagnosis not present

## 2023-06-13 DIAGNOSIS — Z1331 Encounter for screening for depression: Secondary | ICD-10-CM | POA: Diagnosis not present

## 2023-06-13 DIAGNOSIS — M459 Ankylosing spondylitis of unspecified sites in spine: Secondary | ICD-10-CM | POA: Diagnosis not present

## 2023-06-13 DIAGNOSIS — I10 Essential (primary) hypertension: Secondary | ICD-10-CM | POA: Diagnosis not present

## 2023-06-13 DIAGNOSIS — Z23 Encounter for immunization: Secondary | ICD-10-CM | POA: Diagnosis not present

## 2023-08-31 DIAGNOSIS — R7989 Other specified abnormal findings of blood chemistry: Secondary | ICD-10-CM | POA: Diagnosis not present

## 2023-08-31 DIAGNOSIS — R5383 Other fatigue: Secondary | ICD-10-CM | POA: Diagnosis not present

## 2023-08-31 DIAGNOSIS — M797 Fibromyalgia: Secondary | ICD-10-CM | POA: Diagnosis not present

## 2023-08-31 DIAGNOSIS — M450 Ankylosing spondylitis of multiple sites in spine: Secondary | ICD-10-CM | POA: Diagnosis not present

## 2023-08-31 DIAGNOSIS — Z111 Encounter for screening for respiratory tuberculosis: Secondary | ICD-10-CM | POA: Diagnosis not present

## 2023-09-05 DIAGNOSIS — Z1231 Encounter for screening mammogram for malignant neoplasm of breast: Secondary | ICD-10-CM | POA: Diagnosis not present

## 2023-10-03 DIAGNOSIS — Z01419 Encounter for gynecological examination (general) (routine) without abnormal findings: Secondary | ICD-10-CM | POA: Diagnosis not present

## 2023-10-24 ENCOUNTER — Other Ambulatory Visit: Payer: Self-pay

## 2023-10-24 ENCOUNTER — Encounter: Payer: Self-pay | Admitting: Family Medicine

## 2023-10-24 DIAGNOSIS — M5416 Radiculopathy, lumbar region: Secondary | ICD-10-CM

## 2023-11-08 ENCOUNTER — Encounter: Payer: Self-pay | Admitting: Family Medicine

## 2023-11-12 NOTE — Discharge Instructions (Signed)

## 2023-11-13 ENCOUNTER — Ambulatory Visit
Admission: RE | Admit: 2023-11-13 | Discharge: 2023-11-13 | Disposition: A | Discharge status: Home / Self Care | Source: Ambulatory Visit | Attending: Family Medicine | Admitting: Family Medicine

## 2023-11-13 DIAGNOSIS — M4727 Other spondylosis with radiculopathy, lumbosacral region: Secondary | ICD-10-CM | POA: Diagnosis not present

## 2023-11-13 DIAGNOSIS — M5416 Radiculopathy, lumbar region: Secondary | ICD-10-CM

## 2023-11-13 MED ORDER — METHYLPREDNISOLONE ACETATE 40 MG/ML INJ SUSP (RADIOLOG
80.0000 mg | Freq: Once | INTRAMUSCULAR | Status: AC
  Administered 2023-11-13: 80 mg via EPIDURAL

## 2023-11-13 MED ORDER — IOPAMIDOL (ISOVUE-M 200) INJECTION 41%
1.0000 mL | Freq: Once | INTRAMUSCULAR | Status: AC
  Administered 2023-11-13: 1 mL via EPIDURAL

## 2024-03-18 DIAGNOSIS — M792 Neuralgia and neuritis, unspecified: Secondary | ICD-10-CM | POA: Diagnosis not present

## 2024-03-18 DIAGNOSIS — R7989 Other specified abnormal findings of blood chemistry: Secondary | ICD-10-CM | POA: Diagnosis not present

## 2024-03-18 DIAGNOSIS — M450 Ankylosing spondylitis of multiple sites in spine: Secondary | ICD-10-CM | POA: Diagnosis not present

## 2024-03-18 DIAGNOSIS — M797 Fibromyalgia: Secondary | ICD-10-CM | POA: Diagnosis not present

## 2024-03-18 DIAGNOSIS — R5383 Other fatigue: Secondary | ICD-10-CM | POA: Diagnosis not present

## 2024-05-06 NOTE — Progress Notes (Unsigned)
 Tonya Elliott D.[facility] [address] [place] [address] Phone: [phone]   Assessment and Plan:     1. Right foot pain (Primary) 2. Plantar fasciitis, right 3. Strain of right peroneal muscle or tendon -Acute, initial visit - Most consistent with plantar fasciitis and irritation at fifth metatarsal base at insertion of peroneal brevis tendon likely flared due to physical activity, walking [place] the beach - Start meloxicam  15 mg daily x2 weeks.  If still having pain after 2 weeks, complete 3rd-week of NSAID. May use remaining NSAID as needed once daily for pain control.  Do not to use additional over-the-counter NSAIDs (ibuprofen, naproxen, Advil, Aleve, etc.) while taking prescription NSAIDs.  May use Tylenol  [phone] mg 2 to 3 times a day for breakthrough pain. - Start HEP for plantar fasciitis and ankle - Recommend wearing proper, supportive footwear  15 additional minutes spent for educating Therapeutic Home Exercise Program.  This included exercises focusing on stretching, strengthening, with focus on eccentric aspects.   Long term goals include an improvement in range of motion, strength, endurance as well as avoiding reinjury. Patient's frequency would include in 1-2 times a day, 3-5 times a week for a duration of 6-12 weeks. Proper technique shown and discussed handout in great detail with ATC.  All questions were discussed and answered.      Pertinent previous records reviewed include none   Follow Up: As needed if no improvement in 4 weeks.  Could consider physical therapy versus [facility]   Subjective:   I, Tonya Elliott, am serving as a neurosurgeon for Doctor Tonya Elliott  Chief Complaint: foot pain   HPI:   05/07/2024 Patient is a 58 year old female presenting to [facility] for foot pain with numbness.  Right foot pain started about a month ago. Pain is located to the heel and lateral foot. Does endorse numbness and  tingling for a year pcp named it neuropathy. Ibu for the pain and that helps on occasion. Pain is worse in the am and when she is standing. Sharp stabbing pain.     Relevant Historical Information: Ankylosing spondylitis  Additional pertinent review of systems negative.   Current Outpatient Medications:    meloxicam  (MOBIC ) 15 MG tablet, Take 1 tablet daily for 2 weeks.  If still in pain after 2 weeks, take 1 tablet daily for an additional 1 week., Disp: 30 tablet, Rfl: 0   cholecalciferol (VITAMIN D3) 25 MCG (1000 UNIT) tablet, Take 1,000 Units by mouth daily., Disp: , Rfl:    Cyanocobalamin  (VITAMIN B12 PO), Take by mouth., Disp: , Rfl:    cyclobenzaprine (FLEXERIL) 5 MG tablet, Take 5 mg by mouth daily., Disp: , Rfl:    diclofenac sodium (VOLTAREN) 1 % GEL, daily as needed., Disp: , Rfl:    escitalopram (LEXAPRO) 5 MG tablet, 1 tablet, Disp: , Rfl:    etodolac (LODINE) 400 MG tablet, etodolac 400 mg tablet, Disp: , Rfl:    HYDROcodone-acetaminophen  (NORCO/VICODIN) 5-325 MG tablet, Take 1 tablet by mouth every 6 (six) hours as needed for moderate pain., Disp: , Rfl:    olmesartan (BENICAR) 40 MG tablet, olmesartan 40 mg tablet  TAKE 1 TABLET BY MOUTH EVERY DAY, Disp: , Rfl:    omega-3 acid ethyl esters (LOVAZA) 1 g capsule, Take 4 capsules by mouth daily., Disp: , Rfl:    Secukinumab (COSENTYX) 150 MG/ML SOSY, 1 mL, Disp: , Rfl:    TART CHERRY PO, Take by  mouth., Disp: , Rfl:    Objective:     Vitals:   05/07/24 1314  BP: 120/84  Weight: 194 lb (88 kg)  Height: 5' 8 (1.727 m)      Body mass index is 29.5 kg/m.    Physical Exam:    Gen: Appears well, nad, nontoxic and pleasant Psych: Alert and oriented, appropriate mood and affect Neuro: sensation intact, strength is 5/5 with df/pf/inv/ev, muscle tone wnl Skin: no susupicious lesions or rashes  Right ankle:  No deformity.  Lateral ankle swelling along ATFL tendon that patient states is normal and her baseline TTP ATFL,  base of fifth, plantar fascia NTTP over fibular head, lat mal, medial mal, achilles, navicular, base of 5th,  , CFL, deltoid, calcaneous or midfoot ROM DF 30, PF 45, inv/ev intact Negative ant drawer, talar tilt, rotation test, squeeze test. Neg thompson No pain with resisted inversion or eversion    Electronically signed by:  Tonya Elliott D.[facility] 1:54 PM 05/07/24

## 2024-05-07 ENCOUNTER — Ambulatory Visit: Admitting: Sports Medicine

## 2024-05-07 VITALS — BP 120/84 | Ht 68.0 in | Wt 194.0 lb

## 2024-05-07 DIAGNOSIS — M79671 Pain in right foot: Secondary | ICD-10-CM | POA: Diagnosis not present

## 2024-05-07 DIAGNOSIS — S86311A Strain of muscle(s) and tendon(s) of peroneal muscle group at lower leg level, right leg, initial encounter: Secondary | ICD-10-CM

## 2024-05-07 DIAGNOSIS — M722 Plantar fascial fibromatosis: Secondary | ICD-10-CM

## 2024-05-07 MED ORDER — MELOXICAM 15 MG PO TABS: ORAL_TABLET | ORAL | 0 refills | Status: AC

## 2024-05-07 NOTE — Patient Instructions (Signed)
-   Use meloxicam  15 mg daily as needed for breakthrough pain.  Recommend limiting chronic NSAIDs to 1-2 doses per week to prevent long-term side effects. Use Tylenol  500 to 1000 mg tablets 2-3 times a day as needed for day-to-day pain relief.    Foot HEP   As needed follow up if no improvement 4-6 week follow up
